# Patient Record
Sex: Male | Born: 1975
Health system: Southern US, Community
[De-identification: ages and names within clinical notes are randomized; demographics above are authoritative.]

## PROBLEM LIST (undated history)

## (undated) DIAGNOSIS — J45909 Unspecified asthma, uncomplicated: Secondary | ICD-10-CM

## (undated) DIAGNOSIS — D569 Thalassemia, unspecified: Secondary | ICD-10-CM

## (undated) DIAGNOSIS — T7840XA Allergy, unspecified, initial encounter: Secondary | ICD-10-CM

## (undated) HISTORY — DX: Unspecified asthma, uncomplicated: J45.909

## (undated) HISTORY — DX: Allergy, unspecified, initial encounter: T78.40XA

## (undated) HISTORY — DX: Thalassemia, unspecified: D56.9

---

## 2001-09-11 ENCOUNTER — Encounter: Payer: Self-pay | Admitting: Emergency Medicine

## 2001-09-11 ENCOUNTER — Emergency Department (HOSPITAL_COMMUNITY): Admission: EM | Admit: 2001-09-11 | Discharge: 2001-09-11 | Payer: Self-pay | Admitting: Emergency Medicine

## 2001-12-23 ENCOUNTER — Encounter: Payer: Self-pay | Admitting: Orthopedic Surgery

## 2001-12-23 ENCOUNTER — Ambulatory Visit (HOSPITAL_COMMUNITY): Admission: RE | Admit: 2001-12-23 | Discharge: 2001-12-23 | Payer: Self-pay | Admitting: Orthopedic Surgery

## 2007-11-08 ENCOUNTER — Emergency Department (HOSPITAL_COMMUNITY): Admission: EM | Admit: 2007-11-08 | Discharge: 2007-11-08 | Payer: Self-pay | Admitting: Emergency Medicine

## 2014-09-19 ENCOUNTER — Ambulatory Visit (INDEPENDENT_AMBULATORY_CARE_PROVIDER_SITE_OTHER): Payer: PRIVATE HEALTH INSURANCE | Admitting: Family Medicine

## 2014-09-19 VITALS — BP 120/81 | HR 62 | Temp 97.8°F | Resp 18 | Ht 68.0 in | Wt 176.0 lb

## 2014-09-19 DIAGNOSIS — R002 Palpitations: Secondary | ICD-10-CM

## 2014-09-19 DIAGNOSIS — R0789 Other chest pain: Secondary | ICD-10-CM

## 2014-09-19 DIAGNOSIS — J45990 Exercise induced bronchospasm: Secondary | ICD-10-CM

## 2014-09-19 DIAGNOSIS — Z8249 Family history of ischemic heart disease and other diseases of the circulatory system: Secondary | ICD-10-CM | POA: Diagnosis not present

## 2014-09-19 MED ORDER — ALBUTEROL SULFATE HFA 108 (90 BASE) MCG/ACT IN AERS: 2.0000 | INHALATION_SPRAY | RESPIRATORY_TRACT | Status: DC | PRN

## 2014-09-19 MED ORDER — ALBUTEROL SULFATE (2.5 MG/3ML) 0.083% IN NEBU
2.5000 mg | INHALATION_SOLUTION | Freq: Once | RESPIRATORY_TRACT | Status: AC
  Administered 2014-09-19: 2.5 mg via RESPIRATORY_TRACT

## 2014-09-19 NOTE — Patient Instructions (Addendum)
Use the albuterol inhaler 2 inhalations prior to exercise  If you are having chest pain please return or go to the emergency room   Continue getting regular exercise  Take Zyrtec one at bedtime (cetirizine)  Recommend a complete physical sometime

## 2014-09-19 NOTE — Progress Notes (Signed)
Subjective 39 year old man who is here with history of having been diagnosed a decade ago with exercise-induced asthma. He has gotten back into regular exercise and doing some running. When he is doing jogging gets wheezing. He gets some chest tightness sensation also a when he is breathing hard. There is a family history of heart disease in his father has atrial fibrillation intermittently. The patient notices occasional palpitations. He does not smoke. He generally considers himself healthy.  He is married and has 2 sons.  Objective: No major distress, healthy-appearing man. Neck supple without nodes. Chest is clear to also is. Heart regular without murmurs gallops or arrhythmias.  Assessment: History exercise-induced asthma Family history heart disease Chest tightness episodes Palpitations Allergic rhinitis  Plan: EKG, peak flow, nebulizer.  Peak flow was 525 improved to 550 after treatment  EKG normal  Albuterol inhaler Zyrtec  If not improving consider using Singulair or a steroid inhaler

## 2016-10-01 ENCOUNTER — Ambulatory Visit (INDEPENDENT_AMBULATORY_CARE_PROVIDER_SITE_OTHER): Payer: 59 | Admitting: Emergency Medicine

## 2016-10-01 VITALS — BP 111/77 | HR 59 | Temp 98.7°F | Ht 68.0 in | Wt 171.0 lb

## 2016-10-01 DIAGNOSIS — J069 Acute upper respiratory infection, unspecified: Secondary | ICD-10-CM | POA: Diagnosis not present

## 2016-10-01 DIAGNOSIS — R05 Cough: Secondary | ICD-10-CM | POA: Diagnosis not present

## 2016-10-01 DIAGNOSIS — R059 Cough, unspecified: Secondary | ICD-10-CM

## 2016-10-01 DIAGNOSIS — H1032 Unspecified acute conjunctivitis, left eye: Secondary | ICD-10-CM | POA: Diagnosis not present

## 2016-10-01 DIAGNOSIS — J45990 Exercise induced bronchospasm: Secondary | ICD-10-CM | POA: Diagnosis not present

## 2016-10-01 DIAGNOSIS — R070 Pain in throat: Secondary | ICD-10-CM

## 2016-10-01 MED ORDER — TOBRAMYCIN 0.3 % OP SOLN: 2.0000 [drp] | Freq: Four times a day (QID) | OPHTHALMIC | 0 refills | Status: AC

## 2016-10-01 MED ORDER — PROMETHAZINE-CODEINE 6.25-10 MG/5ML PO SYRP: 5.0000 mL | ORAL_SOLUTION | Freq: Every evening | ORAL | 0 refills | Status: DC | PRN

## 2016-10-01 MED ORDER — AZITHROMYCIN 250 MG PO TABS: ORAL_TABLET | ORAL | 0 refills | Status: DC

## 2016-10-01 NOTE — Patient Instructions (Addendum)
IF you received an x-ray today, you will receive an invoice from [facility]. Please contact [facility] at [phone] with questions or concerns regarding your invoice.   IF you received labwork today, you will receive an invoice from [facility]. Please contact [facility] at [phone] with questions or concerns regarding your invoice.   Our billing staff will not be able to assist you with questions regarding bills from these companies.  You will be contacted with the lab results as soon as they are available. The fastest way to get your results is to activate your My Chart account. Instructions are located on the last page of this paperwork. If you have not heard from [place] regarding the results in 2 weeks, please contact this office.      Upper Respiratory Infection, Adult Most upper respiratory infections (URIs) are caused by a virus. A URI affects the nose, throat, and upper air passages. The most common type of URI is often called "the common cold." Follow these instructions at home:  Take medicines only as told by your doctor.  Gargle warm saltwater or take cough drops to comfort your throat as told by your doctor.  Use a warm mist humidifier or inhale steam from a shower to increase air moisture. This may make it easier to breathe.  Drink enough fluid to keep your pee (urine) clear or pale yellow.  Eat soups and other clear broths.  Have a healthy diet.  Rest as needed.  Go back to work when your fever is gone or your doctor says it is okay.  You may need to stay home longer to avoid giving your URI to others.  You can also wear a face mask and wash your hands often to prevent spread of the virus.  Use your inhaler more if you have asthma.  Do not use any tobacco products, including cigarettes, chewing tobacco, or electronic cigarettes. If you need help quitting, ask your doctor. Contact a doctor if:  You are getting worse, not better.  Your  symptoms are not helped by medicine.  You have chills.  You are getting more short of breath.  You have brown or red mucus.  You have yellow or brown discharge from your nose.  You have pain in your face, especially when you bend forward.  You have a fever.  You have puffy (swollen) neck glands.  You have pain while swallowing.  You have white areas in the back of your throat. Get help right away if:  You have very bad or constant:  Headache.  Ear pain.  Pain in your forehead, behind your eyes, and over your cheekbones (sinus pain).  Chest pain.  You have long-lasting (chronic) lung disease and any of the following:  Wheezing.  Long-lasting cough.  Coughing up blood.  A change in your usual mucus.  You have a stiff neck.  You have changes in your:  Vision.  Hearing.  Thinking.  Mood. This information is not intended to replace advice given to you by your health care provider. Make sure you discuss any questions you have with your health care provider. Document Released: 12/02/2007 Document Revised: 02/16/2016 Document Reviewed: 09/20/2013 Elsevier Interactive Patient Education  2017 [employer] Inc[employer]  Bacterial Conjunctivitis Bacterial conjunctivitis is an infection of your conjunctiva. This is the clear membrane that covers the white part of your eye and the inner surface of your eyelid. This condition can make your eye:  Red or pink.  Itchy. This condition  is caused by bacteria. This condition spreads very easily from person to person (is contagious) and from one eye to the other eye. Follow these instructions at home: Medicines   Take or apply your antibiotic medicine as told by your doctor. Do not stop taking or applying the antibiotic even if you start to feel better.  Take or apply over-the-counter and prescription medicines only as told by your doctor.  Do not touch your eyelid with the eye drop bottle or the ointment tube. Managing  discomfort   Wipe any fluid from your eye with a warm, wet washcloth or a cotton ball.  Place a cool, clean washcloth on your eye. Do this for 10-20 minutes, 3-4 times per day. General instructions   Do not wear contact lenses until the irritation is gone. Wear glasses until your doctor says it is okay to wear contacts.  Do not wear eye makeup until your symptoms are gone. Throw away any old makeup.  Change or wash your pillowcase every day.  Do not share towels or washcloths with anyone.  Wash your hands often with soap and water. Use paper towels to dry your hands.  Do not touch or rub your eyes.  Do not drive or use heavy machinery if your vision is blurry. Contact a doctor if:  You have a fever.  Your symptoms do not get better after 10 days. Get help right away if:  You have a fever and your symptoms suddenly get worse.  You have very bad pain when you move your eye.  Your face:  Hurts.  Is red.  Is swollen.  You have sudden loss of vision. This information is not intended to replace advice given to you by your health care provider. Make sure you discuss any questions you have with your health care provider. Document Released: 03/24/2008 Document Revised: 11/21/2015 Document Reviewed: 03/28/2015 Elsevier Interactive Patient Education  2017 [employer].

## 2016-10-01 NOTE — Progress Notes (Signed)
Ray Wong 41 y.o.   Chief Complaint  Patient presents with  . Cough    symptoms x 5 days - yellow/green/brown product  . chest congestion  . Nasal Congestion  . Fatigue  . Eye Drainage    left eye x 2 days  . Sore Throat    HISTORY OF PRESENT ILLNESS: This is a 41 y.o. male has been sick with URI symptoms x 1 week; kids at home also sick but he "can't seem to shake it".  HPI   Prior to Admission medications   Medication Sig Start Date End Date Taking? Authorizing Provider  albuterol (PROVENTIL HFA;VENTOLIN HFA) 108 (90 BASE) MCG/ACT inhaler Inhale 2 puffs into the lungs every 4 (four) hours as needed for wheezing or shortness of breath (cough, shortness of breath or wheezing.). 09/19/14  Yes Ray Najjar, MD    No Known Allergies  There are no active problems to display for this patient.   Past Medical History:  Diagnosis Date  . Allergy   . Asthma   . Thalassemia     No past surgical history on file.  Social History   Social History  . Marital status: Single    Spouse name: N/A  . Number of children: N/A  . Years of education: N/A   Occupational History  . Not on file.   Social History Main Topics  . Smoking status: Never Smoker  . Smokeless tobacco: Never Used  . Alcohol use 0.0 oz/week  . Drug use: No  . Sexual activity: Not on file   Other Topics Concern  . Not on file   Social History Narrative  . No narrative on file    Family History  Problem Relation Age of Onset  . Heart disease Father      Review of Systems  Constitutional: Positive for fever and malaise/fatigue. Negative for chills.  HENT: Positive for congestion and sore throat. Negative for ear discharge, ear pain and nosebleeds.   Eyes: Positive for discharge (left) and redness.  Respiratory: Positive for cough and sputum production. Negative for hemoptysis, shortness of breath and wheezing.   Cardiovascular: Negative for chest pain, palpitations and leg swelling.    Gastrointestinal: Negative for abdominal pain, blood in stool, diarrhea, nausea and vomiting.  Genitourinary: Negative for dysuria and hematuria.  Musculoskeletal: Negative for back pain, myalgias and neck pain.  Skin: Negative for rash.  Neurological: Positive for weakness. Negative for dizziness, sensory change, focal weakness and headaches.  Endo/Heme/Allergies: Negative.   All other systems reviewed and are negative.  Vitals:   10/01/16 0944  BP: 111/77  Pulse: (!) 59  Temp: 98.7 F (37.1 C)     Physical Exam  Constitutional: He is oriented to person, place, and time. He appears well-developed and well-nourished.  HENT:  Head: Normocephalic and atraumatic.  Right Ear: External ear normal.  Left Ear: External ear normal.  Nose: Nose normal.  Mouth/Throat: Posterior oropharyngeal erythema present. No oropharyngeal exudate.  Eyes: EOM are normal. Pupils are equal, round, and reactive to light. Left eye exhibits discharge. Left conjunctiva is injected.  Neck: Normal range of motion. Neck supple. No JVD present. No thyromegaly present.  Cardiovascular: Normal rate, regular rhythm, normal heart sounds and intact distal pulses.   Pulmonary/Chest: Effort normal and breath sounds normal.  Abdominal: Soft. There is no tenderness.  Musculoskeletal: Normal range of motion.  Lymphadenopathy:    He has no cervical adenopathy.  Neurological: He is alert and oriented to person, place,  and time. No sensory deficit. He exhibits normal muscle tone.  Skin: Skin is warm and dry. Capillary refill takes less than 2 seconds. No rash noted.  Psychiatric: He has a normal mood and affect. His behavior is normal.  Vitals reviewed.    ASSESSMENT & PLAN: Ray Wong was seen today for cough, chest congestion, nasal congestion, fatigue, eye drainage and sore throat.  Diagnoses and all orders for this visit:  Acute upper respiratory infection  Cough  Acute conjunctivitis of left eye, unspecified  acute conjunctivitis type  Pain in throat  Exercise-induced asthma  Other orders -     azithromycin (ZITHROMAX) 250 MG tablet; Sig as indicated -     promethazine-codeine (PHENERGAN WITH CODEINE) 6.25-10 MG/5ML syrup; Take 5 mLs by mouth at bedtime as needed for cough. -     tobramycin (TOBREX) 0.3 % ophthalmic solution; Place 2 drops into the left eye every 6 (six) hours.    Patient Instructions       IF you received an x-ray today, you will receive an invoice from [facility]. Please contact [facility] at [phone] with questions or concerns regarding your invoice.   IF you received labwork today, you will receive an invoice from [facility]. Please contact [facility] at [phone] with questions or concerns regarding your invoice.   Our billing staff will not be able to assist you with questions regarding bills from these companies.  You will be contacted with the lab results as soon as they are available. The fastest way to get your results is to activate your My Chart account. Instructions are located on the last page of this paperwork. If you have not heard from [place] regarding the results in 2 weeks, please contact this office.      Upper Respiratory Infection, Adult Most upper respiratory infections (URIs) are caused by a virus. A URI affects the nose, throat, and upper air passages. The most common type of URI is often called "the common cold." Follow these instructions at home:  Take medicines only as told by your doctor.  Gargle warm saltwater or take cough drops to comfort your throat as told by your doctor.  Use a warm mist humidifier or inhale steam from a shower to increase air moisture. This may make it easier to breathe.  Drink enough fluid to keep your pee (urine) clear or pale yellow.  Eat soups and other clear broths.  Have a healthy diet.  Rest as needed.  Go back to work when your fever is gone or your doctor says it is  okay.  You may need to stay home longer to avoid giving your URI to others.  You can also wear a face mask and wash your hands often to prevent spread of the virus.  Use your inhaler more if you have asthma.  Do not use any tobacco products, including cigarettes, chewing tobacco, or electronic cigarettes. If you need help quitting, ask your doctor. Contact a doctor if:  You are getting worse, not better.  Your symptoms are not helped by medicine.  You have chills.  You are getting more short of breath.  You have brown or red mucus.  You have yellow or brown discharge from your nose.  You have pain in your face, especially when you bend forward.  You have a fever.  You have puffy (swollen) neck glands.  You have pain while swallowing.  You have white areas in the back of your throat. Get help right away if:  You have very bad or constant:  Headache.  Ear pain.  Pain in your forehead, behind your eyes, and over your cheekbones (sinus pain).  Chest pain.  You have long-lasting (chronic) lung disease and any of the following:  Wheezing.  Long-lasting cough.  Coughing up blood.  A change in your usual mucus.  You have a stiff neck.  You have changes in your:  Vision.  Hearing.  Thinking.  Mood. This information is not intended to replace advice given to you by your health care provider. Make sure you discuss any questions you have with your health care provider. Document Released: 12/02/2007 Document Revised: 02/16/2016 Document Reviewed: 09/20/2013 Elsevier Interactive Patient Education  2017 [employer] Inc.  Bacterial Conjunctivitis Bacterial conjunctivitis is an infection of your conjunctiva. This is the clear membrane that covers the white part of your eye and the inner surface of your eyelid. This condition can make your eye:  Red or pink.  Itchy. This condition is caused by bacteria. This condition spreads very easily from person to person  (is contagious) and from one eye to the other eye. Follow these instructions at home: Medicines   Take or apply your antibiotic medicine as told by your doctor. Do not stop taking or applying the antibiotic even if you start to feel better.  Take or apply over-the-counter and prescription medicines only as told by your doctor.  Do not touch your eyelid with the eye drop bottle or the ointment tube. Managing discomfort   Wipe any fluid from your eye with a warm, wet washcloth or a cotton ball.  Place a cool, clean washcloth on your eye. Do this for 10-20 minutes, 3-4 times per day. General instructions   Do not wear contact lenses until the irritation is gone. Wear glasses until your doctor says it is okay to wear contacts.  Do not wear eye makeup until your symptoms are gone. Throw away any old makeup.  Change or wash your pillowcase every day.  Do not share towels or washcloths with anyone.  Wash your hands often with soap and water. Use paper towels to dry your hands.  Do not touch or rub your eyes.  Do not drive or use heavy machinery if your vision is blurry. Contact a doctor if:  You have a fever.  Your symptoms do not get better after 10 days. Get help right away if:  You have a fever and your symptoms suddenly get worse.  You have very bad pain when you move your eye.  Your face:  Hurts.  Is red.  Is swollen.  You have sudden loss of vision. This information is not intended to replace advice given to you by your health care provider. Make sure you discuss any questions you have with your health care provider. Document Released: 03/24/2008 Document Revised: 11/21/2015 Document Reviewed: 03/28/2015 Elsevier Interactive Patient Education  2017 [employer] Inc.      Edwina Barth, MD [facility]

## 2017-03-03 ENCOUNTER — Ambulatory Visit (INDEPENDENT_AMBULATORY_CARE_PROVIDER_SITE_OTHER): Payer: 59 | Admitting: Emergency Medicine

## 2017-03-03 ENCOUNTER — Encounter: Payer: Self-pay | Admitting: Emergency Medicine

## 2017-03-03 VITALS — BP 127/82 | HR 54 | Temp 98.1°F | Resp 18 | Ht 68.5 in | Wt 172.0 lb

## 2017-03-03 DIAGNOSIS — Z0000 Encounter for general adult medical examination without abnormal findings: Secondary | ICD-10-CM | POA: Diagnosis not present

## 2017-03-03 NOTE — Patient Instructions (Addendum)
   IF you received an x-ray today, you will receive an invoice from [facility]. Please contact [facility] at [phone] with questions or concerns regarding your invoice.   IF you received labwork today, you will receive an invoice from [facility]. Please contact [facility] at [phone] with questions or concerns regarding your invoice.   Our billing staff will not be able to assist you with questions regarding bills from these companies.  You will be contacted with the lab results as soon as they are available. The fastest way to get your results is to activate your My Chart account. Instructions are located on the last page of this paperwork. If you have not heard from us regarding the results in 2 weeks, please contact this office.      Health Maintenance, Male A healthy lifestyle and preventive care is important for your health and wellness. Ask your health care provider about what schedule of regular examinations is right for you. What should I know about weight and diet? Eat a Healthy Diet  Eat plenty of vegetables, fruits, whole grains, low-fat dairy products, and lean protein.  Do not eat a lot of foods high in solid fats, added sugars, or salt.  Maintain a Healthy Weight Regular exercise can help you achieve or maintain a healthy weight. You should:  Do at least 150 minutes of exercise each week. The exercise should increase your heart rate and make you sweat (moderate-intensity exercise).  Do strength-training exercises at least twice a week.  Watch Your Levels of Cholesterol and Blood Lipids  Have your blood tested for lipids and cholesterol every 5 years starting at 42 years of age. If you are at high risk for heart disease, you should start having your blood tested when you are 42 years old. You may need to have your cholesterol levels checked more often if: ? Your lipid or cholesterol levels are high. ? You are older than 42 years of age. ? You  are at high risk for heart disease.  What should I know about cancer screening? Many types of cancers can be detected early and may often be prevented. Lung Cancer  You should be screened every year for lung cancer if: ? You are a current smoker who has smoked for at least 30 years. ? You are a former smoker who has quit within the past 15 years.  Talk to your health care provider about your screening options, when you should start screening, and how often you should be screened.  Colorectal Cancer  Routine colorectal cancer screening usually begins at 42 years of age and should be repeated every 5-10 years until you are 42 years old. You may need to be screened more often if early forms of precancerous polyps or small growths are found. Your health care provider may recommend screening at an earlier age if you have risk factors for colon cancer.  Your health care provider may recommend using home test kits to check for hidden blood in the stool.  A small camera at the end of a tube can be used to examine your colon (sigmoidoscopy or colonoscopy). This checks for the earliest forms of colorectal cancer.  Prostate and Testicular Cancer  Depending on your age and overall health, your health care provider may do certain tests to screen for prostate and testicular cancer.  Talk to your health care provider about any symptoms or concerns you have about testicular or prostate cancer.  Skin Cancer  Check your skin   from head to toe regularly.  Tell your health care provider about any new moles or changes in moles, especially if: ? There is a change in a mole's size, shape, or color. ? You have a mole that is larger than a pencil eraser.  Always use sunscreen. Apply sunscreen liberally and repeat throughout the day.  Protect yourself by wearing long sleeves, pants, a wide-brimmed hat, and sunglasses when outside.  What should I know about heart disease, diabetes, and high blood  pressure?  If you are 18-39 years of age, have your blood pressure checked every 3-5 years. If you are 40 years of age or older, have your blood pressure checked every year. You should have your blood pressure measured twice-once when you are at a hospital or clinic, and once when you are not at a hospital or clinic. Record the average of the two measurements. To check your blood pressure when you are not at a hospital or clinic, you can use: ? An automated blood pressure machine at a pharmacy. ? A home blood pressure monitor.  Talk to your health care provider about your target blood pressure.  If you are between 45-79 years old, ask your health care provider if you should take aspirin to prevent heart disease.  Have regular diabetes screenings by checking your fasting blood sugar level. ? If you are at a normal weight and have a low risk for diabetes, have this test once every three years after the age of 45. ? If you are overweight and have a high risk for diabetes, consider being tested at a younger age or more often.  A one-time screening for abdominal aortic aneurysm (AAA) by ultrasound is recommended for men aged 65-75 years who are current or former smokers. What should I know about preventing infection? Hepatitis B If you have a higher risk for hepatitis B, you should be screened for this virus. Talk with your health care provider to find out if you are at risk for hepatitis B infection. Hepatitis C Blood testing is recommended for:  Everyone born from 1945 through 1965.  Anyone with known risk factors for hepatitis C.  Sexually Transmitted Diseases (STDs)  You should be screened each year for STDs including gonorrhea and chlamydia if: ? You are sexually active and are younger than 42 years of age. ? You are older than 42 years of age and your health care provider tells you that you are at risk for this type of infection. ? Your sexual activity has changed since you were last  screened and you are at an increased risk for chlamydia or gonorrhea. Ask your health care provider if you are at risk.  Talk with your health care provider about whether you are at high risk of being infected with HIV. Your health care provider may recommend a prescription medicine to help prevent HIV infection.  What else can I do?  Schedule regular health, dental, and eye exams.  Stay current with your vaccines (immunizations).  Do not use any tobacco products, such as cigarettes, chewing tobacco, and e-cigarettes. If you need help quitting, ask your health care provider.  Limit alcohol intake to no more than 2 drinks per day. One drink equals 12 ounces of beer, 5 ounces of wine, or 1 ounces of hard liquor.  Do not use street drugs.  Do not share needles.  Ask your health care provider for help if you need support or information about quitting drugs.  Tell your health care   provider if you often feel depressed.  Tell your health care provider if you have ever been abused or do not feel safe at home. This information is not intended to replace advice given to you by your health care provider. Make sure you discuss any questions you have with your health care provider. Document Released: 12/12/2007 Document Revised: 02/12/2016 Document Reviewed: 03/19/2015 Elsevier Interactive Patient Education  2018 [employer]  American Heart Association (AHA) Exercise Recommendation  Being physically active is important to prevent heart disease and stroke, the nation's No. 1and No. 5killers. To improve overall cardiovascular health, we suggest at least 150 minutes per week of moderate exercise or 75 minutes per week of vigorous exercise (or a combination of moderate and vigorous activity). Thirty minutes a day, five times a week is an easy goal to remember. You will also experience benefits even if you divide your time into two or three segments of 10 to 15 minutes per day.  For people who would  benefit from lowering their blood pressure or cholesterol, we recommend 40 minutes of aerobic exercise of moderate to vigorous intensity three to four times a week to lower the risk for heart attack and stroke.  Physical activity is anything that makes you move your body and burn calories.  This includes things like climbing stairs or playing sports. Aerobic exercises benefit your heart, and include walking, jogging, swimming or biking. Strength and stretching exercises are best for overall stamina and flexibility.  The simplest, positive change you can make to effectively improve your heart health is to start walking. It's enjoyable, free, easy, social and great exercise. A walking program is flexible and boasts high success rates because people can stick with it. It's easy for walking to become a regular and satisfying part of life.   For Overall Cardiovascular Health:  At least 30 minutes of moderate-intensity aerobic activity at least 5 days per week for a total of 150  OR   At least 25 minutes of vigorous aerobic activity at least 3 days per week for a total of 75 minutes; or a combination of moderate- and vigorous-intensity aerobic activity  AND   Moderate- to high-intensity muscle-strengthening activity at least 2 days per week for additional health benefits.  For Lowering Blood Pressure and Cholesterol  An average 40 minutes of moderate- to vigorous-intensity aerobic activity 3 or 4 times per week  What if I can't make it to the time goal? Something is always better than nothing! And everyone has to start somewhere. Even if you've been sedentary for years, today is the day you can begin to make healthy changes in your life. If you don't think you'll make it for 30 or 40 minutes, set a reachable goal for today. You can work up toward your overall goal by increasing your time as you get stronger. Don't let all-or-nothing thinking rob you of doing what you can every day.   Source:http://www.heart.org    

## 2017-03-03 NOTE — Progress Notes (Signed)
Ray Wong 41 y.o.   Chief Complaint  Patient presents with  . Annual Exam    HISTORY OF PRESENT ILLNESS: This is a 41 y.o. male here for annual exam.  HPI   Prior to Admission medications   Medication Sig Start Date End Date Taking? Authorizing Provider  albuterol (PROVENTIL HFA;VENTOLIN HFA) 108 (90 BASE) MCG/ACT inhaler Inhale 2 puffs into the lungs every 4 (four) hours as needed for wheezing or shortness of breath (cough, shortness of breath or wheezing.). 09/19/14  Yes Peyton Najjar, MD  azithromycin [facility]) 250 MG tablet Sig as indicated Patient not taking: Reported on 03/03/2017 10/01/16   Georgina Quint, MD  promethazine-codeine [facility] WITH CODEINE) 6.25-10 MG/5ML syrup Take 5 mLs by mouth at bedtime as needed for cough. Patient not taking: Reported on 03/03/2017 10/01/16   Georgina Quint, MD    No Known Allergies  Patient Active Problem List   Diagnosis Date Noted  . Acute upper respiratory infection 10/01/2016  . Cough 10/01/2016  . Acute conjunctivitis of left eye 10/01/2016  . Pain in throat 10/01/2016  . Exercise-induced asthma 10/01/2016    Past Medical History:  Diagnosis Date  . Allergy   . Asthma   . Thalassemia     No past surgical history on file.  Social History   Social History  . Marital status: Single    Spouse name: N/A  . Number of children: N/A  . Years of education: N/A   Occupational History  . Not on file.   Social History Main Topics  . Smoking status: Never Smoker  . Smokeless tobacco: Never Used  . Alcohol use 0.0 oz/week  . Drug use: No  . Sexual activity: Not on file   Other Topics Concern  . Not on file   Social History Narrative  . No narrative on file    Family History  Problem Relation Age of Onset  . Heart disease Father      Review of Systems  Constitutional: Negative.  Negative for fever, malaise/fatigue and weight loss.  HENT: Negative.   Eyes: Negative.   Respiratory:  Negative.   Cardiovascular: Negative.   Gastrointestinal: Negative.   Genitourinary: Negative.   Musculoskeletal: Positive for joint pain (left forearm and left foot pain).  Skin: Positive for rash (lesions in head and face).  Neurological: Negative.   Endo/Heme/Allergies: Negative.   Psychiatric/Behavioral: The patient is nervous/anxious (stress).   All other systems reviewed and are negative.    Vitals:   03/03/17 1531  BP: 127/82  Pulse: (!) 54  Resp: 18  Temp: 98.1 F (36.7 C)  SpO2: 98%     Physical Exam  Constitutional: He is oriented to person, place, and time. He appears well-developed and well-nourished.  HENT:  Head: Normocephalic and atraumatic.  Right Ear: External ear normal.  Left Ear: External ear normal.  Nose: Nose normal.  Mouth/Throat: Oropharynx is clear and moist.  Eyes: Pupils are equal, round, and reactive to light. Conjunctivae and EOM are normal.  Neck: Normal range of motion. Neck supple. No JVD present. No thyromegaly present.  Cardiovascular: Normal rate, regular rhythm, normal heart sounds and intact distal pulses.   Pulmonary/Chest: Effort normal and breath sounds normal.  Abdominal: Soft. Bowel sounds are normal. He exhibits no distension. There is no tenderness.  Musculoskeletal: Normal range of motion.  Left forearm: mild tenderness to proximal brachioradialis muscle Left foot: mild plantar heel tenderness  Lymphadenopathy:    He has no cervical  adenopathy.  Neurological: He is alert and oriented to person, place, and time. No sensory deficit. He exhibits normal muscle tone.  Skin: Skin is warm and dry. Capillary refill takes less than 2 seconds.  Several flat lesions to right side of face and scalp  Psychiatric: He has a normal mood and affect. His behavior is normal.  Vitals reviewed.    ASSESSMENT & PLAN: Ray Wong was seen today for annual exam.  Diagnoses and all orders for this visit:  Routine general medical examination at a  health care facility -     CBC with Differential -     Comprehensive metabolic panel -     Hemoglobin A1c -     Lipid panel -     PSA(Must document that pt has been informed of limitations of PSA testing.) -     TSH -     HIV antibody -     Ambulatory referral to Dermatology    Patient Instructions       IF you received an x-ray today, you will receive an invoice [facility]. Please contact [facility] at [phone] with questions or concerns regarding your invoice.   IF you received labwork today, you will receive an invoice from [facility]. Please contact [facility] at [phone] with questions or concerns regarding your invoice.   Our billing staff will not be able to assist you with questions regarding bills from these companies.  You will be contacted with the lab results as soon as they are available. The fastest way to get your results is to activate your My Chart account. Instructions are located on the last page of this paperwork. If you have not heard from us regarding the results in 2 weeks, please contact this office.         Health Maintenance, Male A healthy lifestyle and preventive care is important for your health and wellness. Ask your health care provider about what schedule of regular examinations is right for you. What should I know about weight and diet? Eat a Healthy Diet  Eat plenty of vegetables, fruits, whole grains, low-fat dairy products, and lean protein.  Do not eat a lot of foods high in solid fats, added sugars, or salt.  Maintain a Healthy Weight Regular exercise can help you achieve or maintain a healthy weight. You should:  Do at least 150 minutes of exercise each week. The exercise should increase your heart rate and make you sweat (moderate-intensity exercise).  Do strength-training exercises at least twice a week.  Watch Your Levels of Cholesterol and Blood Lipids  Have your blood tested for lipids and  cholesterol every 5 years starting at 42 years of age. If you are at high risk for heart disease, you should start having your blood tested when you are 42 years old. You may need to have your cholesterol levels checked more often if: ? Your lipid or cholesterol levels are high. ? You are older than 42 years of age. ? You are at high risk for heart disease.  What should I know about cancer screening? Many types of cancers can be detected early and may often be prevented. Lung Cancer  You should be screened every year for lung cancer if: ? You are a current smoker who has smoked for at least 30 years. ? You are a former smoker who has quit within the past 15 years.  Talk to your health care provider about your screening options, when you should start screening, and  how often you should be screened.  Colorectal Cancer  Routine colorectal cancer screening usually begins at 42 years of age and should be repeated every 5-10 years until you are 42 years old. You may need to be screened more often if early forms of precancerous polyps or small growths are found. Your health care provider may recommend screening at an earlier age if you have risk factors for colon cancer.  Your health care provider may recommend using home test kits to check for hidden blood in the stool.  A small camera at the end of a tube can be used to examine your colon (sigmoidoscopy or colonoscopy). This checks for the earliest forms of colorectal cancer.  Prostate and Testicular Cancer  Depending on your age and overall health, your health care provider may do certain tests to screen for prostate and testicular cancer.  Talk to your health care provider about any symptoms or concerns you have about testicular or prostate cancer.  Skin Cancer  Check your skin from head to toe regularly.  Tell your health care provider about any new moles or changes in moles, especially if: ? There is a change in a mole's size, shape,  or color. ? You have a mole that is larger than a pencil eraser.  Always use sunscreen. Apply sunscreen liberally and repeat throughout the day.  Protect yourself by wearing long sleeves, pants, a wide-brimmed hat, and sunglasses when outside.  What should I know about heart disease, diabetes, and high blood pressure?  If you are 52-69 years of age, have your blood pressure checked every 3-5 years. If you are 42 years of age or older, have your blood pressure checked every year. You should have your blood pressure measured twice-once when you are at a hospital or clinic, and once when you are not at a hospital or clinic. Record the average of the two measurements. To check your blood pressure when you are not at a hospital or clinic, you can use: ? An automated blood pressure machine at a pharmacy. ? A home blood pressure monitor.  Talk to your health care provider about your target blood pressure.  If you are between 30-67 years old, ask your health care provider if you should take aspirin to prevent heart disease.  Have regular diabetes screenings by checking your fasting blood sugar level. ? If you are at a normal weight and have a low risk for diabetes, have this test once every three years after the age of 27. ? If you are overweight and have a high risk for diabetes, consider being tested at a younger age or more often.  A one-time screening for abdominal aortic aneurysm (AAA) by ultrasound is recommended for men aged 65-75 years who are current or former smokers. What should I know about preventing infection? Hepatitis B If you have a higher risk for hepatitis B, you should be screened for this virus. Talk with your health care provider to find out if you are at risk for hepatitis B infection. Hepatitis C Blood testing is recommended for:  Everyone born from [birth year] through 1965.  Anyone with known risk factors for hepatitis C.  Sexually Transmitted Diseases (STDs)  You should  be screened each year for STDs including gonorrhea and chlamydia if: ? You are sexually active and are younger than 42 years of age. ? You are older than 42 years of age and your health care provider tells you that you are at risk for this type  of infection. ? Your sexual activity has changed since you were last screened and you are at an increased risk for chlamydia or gonorrhea. Ask your health care provider if you are at risk.  Talk with your health care provider about whether you are at high risk of being infected with HIV. Your health care provider may recommend a prescription medicine to help prevent HIV infection.  What else can I do?  Schedule regular health, dental, and eye exams.  Stay current with your vaccines (immunizations).  Do not use any tobacco products, such as cigarettes, chewing tobacco, and e-cigarettes. If you need help quitting, ask your health care provider.  Limit alcohol intake to no more than 2 drinks per day. One drink equals 12 ounces of beer, 5 ounces of wine, or 1 ounces of hard liquor.  Do not use street drugs.  Do not share needles.  Ask your health care provider for help if you need support or information about quitting drugs.  Tell your health care provider if you often feel depressed.  Tell your health care provider if you have ever been abused or do not feel safe at home. This information is not intended to replace advice given to you by your health care provider. Make sure you discuss any questions you have with your health care provider. Document Released: 12/12/2007 Document Revised: 02/12/2016 Document Reviewed: 03/19/2015 Elsevier Interactive Patient Education  2018 [employer].  American Heart Association (AHA) Exercise Recommendation  Being physically active is important to prevent heart disease and stroke, the nation's No. 1and No. 5killers. To improve overall cardiovascular health, we suggest at least 150 minutes per week of moderate  exercise or 75 minutes per week of vigorous exercise (or a combination of moderate and vigorous activity). Thirty minutes a day, five times a week is an easy goal to remember. You will also experience benefits even if you divide your time into two or three segments of 10 to 15 minutes per day.  For people who would benefit from lowering their blood pressure or cholesterol, we recommend 40 minutes of aerobic exercise of moderate to vigorous intensity three to four times a week to lower the risk for heart attack and stroke.  Physical activity is anything that makes you move your body and burn calories.  This includes things like climbing stairs or playing sports. Aerobic exercises benefit your heart, and include walking, jogging, swimming or biking. Strength and stretching exercises are best for overall stamina and flexibility.  The simplest, positive change you can make to effectively improve your heart health is to start walking. It's enjoyable, free, easy, social and great exercise. A walking program is flexible and boasts high success rates because people can stick with it. It's easy for walking to become a regular and satisfying part of life.   For Overall Cardiovascular Health:  At least 30 minutes of moderate-intensity aerobic activity at least 5 days per week for a total of 150  OR   At least 25 minutes of vigorous aerobic activity at least 3 days per week for a total of 75 minutes; or a combination of moderate- and vigorous-intensity aerobic activity  AND   Moderate- to high-intensity muscle-strengthening activity at least 2 days per week for additional health benefits.  For Lowering Blood Pressure and Cholesterol  An average 40 minutes of moderate- to vigorous-intensity aerobic activity 3 or 4 times per week  What if I can't make it to the time goal? Something is always better than  nothing! And everyone has to start somewhere. Even if you've been sedentary for years, today is  the day you can begin to make healthy changes in your life. If you don't think you'll make it for 30 or 40 minutes, set a reachable goal for today. You can work up toward your overall goal by increasing your time as you get stronger. Don't let all-or-nothing thinking rob you of doing what you can every day.  Source:[email or web address], MD [facility]

## 2017-03-04 ENCOUNTER — Other Ambulatory Visit: Payer: Self-pay | Admitting: Emergency Medicine

## 2017-03-04 DIAGNOSIS — D569 Thalassemia, unspecified: Secondary | ICD-10-CM

## 2017-03-04 LAB — HEMOGLOBIN A1C
ESTIMATED AVERAGE GLUCOSE: 94 mg/dL
HEMOGLOBIN A1C: 4.9 % (ref 4.8–5.6)

## 2017-03-04 LAB — COMPREHENSIVE METABOLIC PANEL
ALBUMIN: 4.7 g/dL (ref 3.5–5.5)
ALK PHOS: 67 IU/L (ref 39–117)
ALT: 13 IU/L (ref 0–44)
AST: 15 IU/L (ref 0–40)
Albumin/Globulin Ratio: 2.2 (ref 1.2–2.2)
BUN / CREAT RATIO: 13 (ref 9–20)
BUN: 17 mg/dL (ref 6–24)
Bilirubin Total: 1.2 mg/dL (ref 0.0–1.2)
CALCIUM: 9.2 mg/dL (ref 8.7–10.2)
CO2: 24 mmol/L (ref 20–29)
CREATININE: 1.26 mg/dL (ref 0.76–1.27)
Chloride: 105 mmol/L (ref 96–106)
GFR calc Af Amer: 81 mL/min/{1.73_m2} (ref >59)
GFR calc non Af Amer: 70 mL/min/{1.73_m2} (ref >59)
Globulin, Total: 2.1 g/dL (ref 1.5–4.5)
Glucose: 92 mg/dL (ref 65–99)
Potassium: 4.7 mmol/L (ref 3.5–5.2)
Sodium: 143 mmol/L (ref 134–144)
Total Protein: 6.8 g/dL (ref 6.0–8.5)

## 2017-03-04 LAB — CBC WITH DIFFERENTIAL/PLATELET
BASOS ABS: 0 10*3/uL (ref 0.0–0.2)
Basos: 2 %
EOS (ABSOLUTE): 0 10*3/uL (ref 0.0–0.4)
EOS: 1 %
HEMATOCRIT: 38.5 % (ref 37.5–51.0)
HEMOGLOBIN: 12.1 g/dL — AB (ref 13.0–17.7)
IMMATURE GRANULOCYTES: 0 %
Immature Grans (Abs): 0 10*3/uL (ref 0.0–0.1)
LYMPHS ABS: 1 10*3/uL (ref 0.7–3.1)
LYMPHS: 40 %
MCH: 19.5 pg — ABNORMAL LOW (ref 26.6–33.0)
MCHC: 31.4 g/dL — AB (ref 31.5–35.7)
MCV: 62 fL — ABNORMAL LOW (ref 79–97)
MONOCYTES: 15 %
Monocytes Absolute: 0.4 10*3/uL (ref 0.1–0.9)
NEUTROS PCT: 42 %
Neutrophils Absolute: 1.1 10*3/uL — ABNORMAL LOW (ref 1.4–7.0)
Platelets: 181 10*3/uL (ref 150–379)
RBC: 6.21 x10E6/uL — AB (ref 4.14–5.80)
RDW: 17.7 % — ABNORMAL HIGH (ref 12.3–15.4)
WBC: 2.5 10*3/uL — AB (ref 3.4–10.8)

## 2017-03-04 LAB — LIPID PANEL
Chol/HDL Ratio: 2.9 ratio (ref 0.0–5.0)
Cholesterol, Total: 129 mg/dL (ref 100–199)
HDL: 45 mg/dL (ref >39)
LDL Calculated: 66 mg/dL (ref 0–99)
TRIGLYCERIDES: 92 mg/dL (ref 0–149)
VLDL CHOLESTEROL CAL: 18 mg/dL (ref 5–40)

## 2017-03-04 LAB — TSH: TSH: 1.54 u[IU]/mL (ref 0.450–4.500)

## 2017-03-04 LAB — PSA: PROSTATE SPECIFIC AG, SERUM: 1.6 ng/mL (ref 0.0–4.0)

## 2017-03-04 LAB — HIV ANTIBODY (ROUTINE TESTING W REFLEX): HIV SCREEN 4TH GENERATION: NONREACTIVE

## 2017-03-05 ENCOUNTER — Encounter: Payer: Self-pay | Admitting: Radiology

## 2017-03-15 ENCOUNTER — Encounter: Payer: Self-pay | Admitting: Physician Assistant

## 2017-03-15 ENCOUNTER — Ambulatory Visit (INDEPENDENT_AMBULATORY_CARE_PROVIDER_SITE_OTHER): Payer: 59

## 2017-03-15 ENCOUNTER — Ambulatory Visit (INDEPENDENT_AMBULATORY_CARE_PROVIDER_SITE_OTHER): Payer: 59 | Admitting: Physician Assistant

## 2017-03-15 VITALS — BP 127/82 | HR 60 | Resp 16 | Ht 68.5 in | Wt 171.8 lb

## 2017-03-15 DIAGNOSIS — D72819 Decreased white blood cell count, unspecified: Secondary | ICD-10-CM

## 2017-03-15 DIAGNOSIS — Z8781 Personal history of (healed) traumatic fracture: Secondary | ICD-10-CM

## 2017-03-15 DIAGNOSIS — S1982XA Other specified injuries of cervical trachea, initial encounter: Secondary | ICD-10-CM | POA: Diagnosis not present

## 2017-03-15 DIAGNOSIS — M542 Cervicalgia: Secondary | ICD-10-CM

## 2017-03-15 DIAGNOSIS — D709 Neutropenia, unspecified: Secondary | ICD-10-CM | POA: Insufficient documentation

## 2017-03-15 MED ORDER — MELOXICAM 15 MG PO TABS: 7.5000 mg | ORAL_TABLET | Freq: Every day | ORAL | 0 refills | Status: AC

## 2017-03-15 MED ORDER — TRAMADOL HCL 50 MG PO TABS: 25.0000 mg | ORAL_TABLET | Freq: Two times a day (BID) | ORAL | 0 refills | Status: DC | PRN

## 2017-03-15 NOTE — Progress Notes (Signed)
03/15/2017 9:59 AM   DOB: [birth year] / MRN: [id number]  SUBJECTIVE:  Ray Wong is a 41 y.o. male presenting for neck pain after horseplay with his kids last night.  Felt a pop.  Has tried ibuprofen and tylenol last night and today with poor relief.  No tingling or weakness in his hands. History of fracture about C6-C7 healed in roughly 2003.   He has No Known Allergies.   He  has a past medical history of Allergy; Asthma; and Thalassemia.    He  reports that he has never smoked. He has never used smokeless tobacco. He reports that he drinks alcohol. He reports that he does not use drugs. He  has no sexual activity history on file. The patient  has no past surgical history on file.  His family history includes Heart disease in his father.  Review of Systems  Constitutional: Negative for chills, diaphoresis and fever.  Respiratory: Negative for shortness of breath.   Cardiovascular: Negative for chest pain, orthopnea and leg swelling.  Gastrointestinal: Negative for nausea.  Skin: Negative for rash.  Neurological: Negative for dizziness.    The problem list and medications were reviewed and updated by myself where necessary and exist elsewhere in the encounter.   OBJECTIVE:  BP 127/82 (BP Location: Right Arm, Patient Position: Sitting, Cuff Size: Normal)   Pulse 60   Resp 16   Ht 5' 8.5" (1.74 m)   Wt 171 lb 12.8 oz (77.9 kg)   SpO2 99%   BMI 25.74 kg/m   Lab Results  Component Value Date   WBC 2.5 (LL) 03/03/2017   HGB 12.1 (L) 03/03/2017   HCT 38.5 03/03/2017   MCV 62 (L) 03/03/2017   PLT 181 03/03/2017   Physical Exam  Constitutional: He appears well-developed. He is active and cooperative.  Non-toxic appearance.  Cardiovascular: Normal rate, regular rhythm, S1 normal, S2 normal, normal heart sounds, intact distal pulses and normal pulses.  Exam reveals no gallop and no friction rub.   No murmur heard. Pulmonary/Chest: Effort normal. No stridor. No tachypnea.  No respiratory distress. He has no wheezes. He has no rales.  Abdominal: He exhibits no distension.  Musculoskeletal: Normal range of motion. He exhibits no edema or deformity.       Cervical back: He exhibits tenderness and pain. He exhibits no bony tenderness, no swelling, no edema and no spasm.  Neurological: He is alert.  Skin: Skin is warm and dry. He is not diaphoretic. No pallor.  Vitals reviewed.   No results found for this or any previous visit (from the past 72 hour(s)).  Dg Cervical Spine 2 Or 3 Views  Result Date: 03/15/2017 CLINICAL DATA:  Neck pain status post fall. EXAM: CERVICAL SPINE - 2-3 VIEW COMPARISON:  11/08/2007 FINDINGS: There is no evidence of cervical spine fracture or prevertebral soft tissue swelling. Alignment is normal. No other significant bone abnormalities are identified. Degenerative disc disease with mild disc height loss at C5-6 and C6-7. Bilateral facet arthropathy at C7-T1. IMPRESSION: No acute osseous injury of the cervical spine. Electronically Signed   By: Elige Ko   On: 03/15/2017 09:56    ASSESSMENT AND PLAN:  [name] was seen today for neck pain.  Diagnoses and all orders for this visit:  Neck pain: Exam unremarkable.  Given problem two I felt images would be helpful to establish new baseline.  Meloxciam and tramadol along with about 3 weeks.  Will step up to pred if needed.  If that fails then neurosurgery.  -     DG Cervical Spine 2 or 3 views; Future -     meloxicam (MOBIC) 15 MG tablet; Take 0.5-1 tablets (7.5-15 mg total) by mouth daily. Take with food. Do not take Ibuprofen, Goody's, or Aleve while taking this medication. -     traMADol (ULTRAM) 50 MG tablet; Take 0.5-1 tablets (25-50 mg total) by mouth every 12 (twelve) hours as needed.  History of cervical fracture  Leukopenia, unspecified type Comments: Hematology referral is out at this time.     The patient is advised to call or return to clinic if he does not see an  improvement in symptoms, or to seek the care of the closest emergency department if he worsens with the above plan.   Deliah Boston, MHS, PA-C Primary Care at [facility] 03/15/2017 9:59 AM

## 2017-03-15 NOTE — Patient Instructions (Signed)
  FINDINGS: There is no evidence of cervical spine fracture or prevertebral soft tissue swelling. Alignment is normal. No other significant bone abnormalities are identified. Degenerative disc disease with mild disc height loss at C5-6 and C6-7. Bilateral facet arthropathy at C7-T1.  IMPRESSION: No acute osseous injury of the cervical spine.  Lets give it about three weeks.  If you are getting worse we can consider prednisone by mouth.

## 2017-03-16 ENCOUNTER — Other Ambulatory Visit: Payer: Self-pay | Admitting: Emergency Medicine

## 2017-03-16 ENCOUNTER — Telehealth: Payer: Self-pay | Admitting: Emergency Medicine

## 2017-03-16 DIAGNOSIS — L989 Disorder of the skin and subcutaneous tissue, unspecified: Secondary | ICD-10-CM

## 2017-03-16 NOTE — Telephone Encounter (Signed)
Done

## 2017-03-16 NOTE — Telephone Encounter (Signed)
Pt has referral for dermatology but diagnosis is for routine medical exam. I believe pt is being referred for a rash according to OV notes. Can we get a diagnosis code placed for this? Thanks!

## 2017-03-19 ENCOUNTER — Telehealth: Payer: Self-pay

## 2017-03-19 NOTE — Telephone Encounter (Signed)
Started PA over the phone.  They should have an answer within 5 days.

## 2017-03-29 NOTE — Telephone Encounter (Signed)
Received denial via fax this morning.  I will make patient and pharmacy aware.

## 2017-05-06 ENCOUNTER — Encounter: Payer: Self-pay | Admitting: Hematology and Oncology

## 2017-05-06 ENCOUNTER — Ambulatory Visit: Payer: 59 | Admitting: Hematology and Oncology

## 2017-05-06 ENCOUNTER — Telehealth: Payer: Self-pay | Admitting: Hematology and Oncology

## 2017-05-06 ENCOUNTER — Ambulatory Visit (HOSPITAL_BASED_OUTPATIENT_CLINIC_OR_DEPARTMENT_OTHER): Payer: 59

## 2017-05-06 VITALS — BP 116/87 | HR 74 | Temp 98.4°F | Resp 18 | Ht 68.5 in | Wt 175.1 lb

## 2017-05-06 DIAGNOSIS — R718 Other abnormality of red blood cells: Secondary | ICD-10-CM | POA: Diagnosis not present

## 2017-05-06 DIAGNOSIS — D709 Neutropenia, unspecified: Secondary | ICD-10-CM

## 2017-05-06 LAB — CBC & DIFF AND RETIC
BASO%: 1.1 % (ref 0.0–2.0)
Basophils Absolute: 0 10*3/uL (ref 0.0–0.1)
EOS%: 1.1 % (ref 0.0–7.0)
Eosinophils Absolute: 0 10*3/uL (ref 0.0–0.5)
HCT: 39.4 % (ref 38.4–49.9)
HGB: 12.5 g/dL — ABNORMAL LOW (ref 13.0–17.1)
Immature Retic Fract: 6.6 % (ref 3.00–10.60)
LYMPH#: 1.6 10*3/uL (ref 0.9–3.3)
LYMPH%: 42.7 % (ref 14.0–49.0)
MCH: 19.9 pg — ABNORMAL LOW (ref 27.2–33.4)
MCHC: 31.7 g/dL — AB (ref 32.0–36.0)
MCV: 62.8 fL — AB (ref 79.3–98.0)
MONO#: 0.4 10*3/uL (ref 0.1–0.9)
MONO%: 9.7 % (ref 0.0–14.0)
NEUT%: 45.4 % (ref 39.0–75.0)
NEUTROS ABS: 1.7 10*3/uL (ref 1.5–6.5)
PLATELETS: 212 10*3/uL (ref 140–400)
RBC: 6.27 10*6/uL — AB (ref 4.20–5.82)
RDW: 16.2 % — ABNORMAL HIGH (ref 11.0–14.6)
RETIC CT ABS: 142.96 10*3/uL — AB (ref 34.80–93.90)
Retic %: 2.28 % — ABNORMAL HIGH (ref 0.80–1.80)
WBC: 3.7 10*3/uL — AB (ref 4.0–10.3)

## 2017-05-06 LAB — IRON AND TIBC
%SAT: 41 % (ref 20–55)
IRON: 137 ug/dL (ref 42–163)
TIBC: 338 ug/dL (ref 202–409)
UIBC: 201 ug/dL (ref 117–376)

## 2017-05-06 LAB — COMPREHENSIVE METABOLIC PANEL
ALT: 56 U/L — AB (ref 0–55)
ANION GAP: 10 meq/L (ref 3–11)
AST: 25 U/L (ref 5–34)
Albumin: 4.7 g/dL (ref 3.5–5.0)
Alkaline Phosphatase: 66 U/L (ref 40–150)
BUN: 18.9 mg/dL (ref 7.0–26.0)
CALCIUM: 9.7 mg/dL (ref 8.4–10.4)
CHLORIDE: 104 meq/L (ref 98–109)
CO2: 26 meq/L (ref 22–29)
CREATININE: 1.2 mg/dL (ref 0.7–1.3)
Glucose: 88 mg/dl (ref 70–140)
Potassium: 4.2 mEq/L (ref 3.5–5.1)
Sodium: 139 mEq/L (ref 136–145)
Total Bilirubin: 1.73 mg/dL — ABNORMAL HIGH (ref 0.20–1.20)
Total Protein: 7.5 g/dL (ref 6.4–8.3)

## 2017-05-06 LAB — FERRITIN: FERRITIN: 483 ng/mL — AB (ref 22–316)

## 2017-05-06 LAB — LACTATE DEHYDROGENASE: LDH: 155 U/L (ref 125–245)

## 2017-05-06 LAB — TECHNOLOGIST REVIEW

## 2017-05-06 LAB — CHCC SMEAR

## 2017-05-06 NOTE — Telephone Encounter (Signed)
Scheduled appt per 11/8 los - Gave patient AVS and calender per los.  

## 2017-05-07 LAB — CERULOPLASMIN: CERULOPLASMIN: 17.7 mg/dL (ref 16.0–31.0)

## 2017-05-08 LAB — COPPER, SERUM: Copper: 83 ug/dL (ref 72–166)

## 2017-05-13 ENCOUNTER — Encounter: Payer: Self-pay | Admitting: Hematology and Oncology

## 2017-05-13 ENCOUNTER — Telehealth: Payer: Self-pay | Admitting: Hematology and Oncology

## 2017-05-13 ENCOUNTER — Ambulatory Visit (HOSPITAL_BASED_OUTPATIENT_CLINIC_OR_DEPARTMENT_OTHER): Payer: 59

## 2017-05-13 ENCOUNTER — Ambulatory Visit: Payer: 59 | Admitting: Hematology and Oncology

## 2017-05-13 ENCOUNTER — Other Ambulatory Visit: Payer: Self-pay

## 2017-05-13 VITALS — BP 125/75 | HR 57 | Temp 97.9°F | Resp 18 | Ht 68.5 in | Wt 173.4 lb

## 2017-05-13 DIAGNOSIS — D709 Neutropenia, unspecified: Secondary | ICD-10-CM | POA: Diagnosis not present

## 2017-05-13 DIAGNOSIS — R718 Other abnormality of red blood cells: Secondary | ICD-10-CM

## 2017-05-13 DIAGNOSIS — D561 Beta thalassemia: Secondary | ICD-10-CM

## 2017-05-13 NOTE — Telephone Encounter (Signed)
Gave patient avs and calendar with appts per 11/15 los.  °

## 2017-05-14 LAB — HEMOGLOBINOPATHY EVALUATION
HGB C: 0 %
HGB S: 0 %
HGB VARIANT: 0 %
Hemoglobin A2 Quantitation: 4.9 % — ABNORMAL HIGH (ref 1.8–3.2)
Hemoglobin F Quantitation: 1.2 % (ref 0.0–2.0)
Hgb A: 93.9 % — ABNORMAL LOW (ref 96.4–98.8)

## 2017-05-16 NOTE — Progress Notes (Signed)
[facility] New Visit:  Assessment: Neutropenia [facility]) 41 y.o. male without any active symptoms presenting with history of thalassemia and leukopenia with predominance of neutropenia of unknown duration on the most recent lab work.  Patient has no evidence of active infections by symptoms or clinical examination.  Low white blood cell count may signify development of myelofibrosis as the patient's with diagnosis of thalassemia does suffer from inefficient erythropoiesis which puts increased strain on the bone marrow production capacity.  In addition, she has mild anemia with profound microcytosis and hypochromia consistent with beta thalassemia, but I would like to confirm the diagnosis by obtaining hemoglobin electrophoresis.  Plan: --Labs today as outlined below --Return to clinic in 1 week to discuss the findings  Voice recognition software was used and creation of this note. Despite my best effort at editing the text, some misspelling/errors may have occurred.  Orders Placed This Encounter  Procedures  . CBC & Diff and Retic    Standing Status:   Future    Number of Occurrences:   1    Standing Expiration Date:   05/06/2018  . Smear    Standing Status:   Future    Number of Occurrences:   1    Standing Expiration Date:   05/06/2018  . Lactate dehydrogenase (LDH)    Standing Status:   Future    Number of Occurrences:   1    Standing Expiration Date:   05/06/2018  . Comprehensive metabolic panel    Standing Status:   Future    Number of Occurrences:   1    Standing Expiration Date:   05/06/2018  . Ferritin    Standing Status:   Future    Number of Occurrences:   1    Standing Expiration Date:   05/06/2018  . Iron and TIBC    Standing Status:   Future    Number of Occurrences:   1    Standing Expiration Date:   05/06/2018  . Copper, serum    Standing Status:   Future    Number of Occurrences:   1    Standing Expiration Date:   05/06/2018  . Ceruloplasmin     Standing Status:   Future    Number of Occurrences:   1    Standing Expiration Date:   05/06/2018    All questions were answered.  . The patient knows to call the clinic with any problems, questions or concerns.  This note was electronically signed.    History of Presenting Illness Ray Wong 41 y.o. presenting to the [facility] for leukopenia evaluation read by Dr Horald Pollen.  Please see below for the available lab work.  Patient has a history of thalassemia that he has carried since his childhood.  He does not remember the type.  Otherwise, he denies any significant symptoms.  He denies any recurrent infections, fevers, chills, weight loss, appetite change, night sweats.  He denies any changes in activity tolerance.  No respiratory, gastrointestinal, or genitourinary complaints.  No skin lesions or neurological deficits.  Oncological/hematological History: --Labs, 03/03/17: WBC 2.5, ANC 1.1, ALC 1.0, Mono 0.4, Eos 0.0, Baso 0.0, Hgb 12.1 , MCV 62.0, MCH 19.5, RDW 17.7, Plt 181;   Medical History: Past Medical History:  Diagnosis Date  . Allergy   . Asthma   . Thalassemia     Surgical History: History reviewed. No pertinent surgical history.  Family History: Family History  Problem Relation  Age of Onset  . Heart disease Father     Social History: Social History   Socioeconomic History  . Marital status: Single    Spouse name: Not on file  . Number of children: Not on file  . Years of education: Not on file  . Highest education level: Not on file  Social Needs  . Financial resource strain: Not on file  . Food insecurity - worry: Not on file  . Food insecurity - inability: Not on file  . Transportation needs - medical: Not on file  . Transportation needs - non-medical: Not on file  Occupational History  . Not on file  Tobacco Use  . Smoking status: Never Smoker  . Smokeless tobacco: Never Used  Substance and Sexual Activity  . Alcohol use: Yes     Alcohol/week: 0.0 oz  . Drug use: No  . Sexual activity: Not on file  Other Topics Concern  . Not on file  Social History Narrative  . Not on file    Allergies: No Known Allergies  Medications:  Current Outpatient Medications  Medication Sig Dispense Refill  . albuterol (PROVENTIL HFA;VENTOLIN HFA) 108 (90 BASE) MCG/ACT inhaler Inhale 2 puffs into the lungs every 4 (four) hours as needed for wheezing or shortness of breath (cough, shortness of breath or wheezing.). 1 Inhaler 3  . azithromycin (ZITHROMAX) 250 MG tablet Sig as indicated 6 tablet 0  . promethazine-codeine (PHENERGAN WITH CODEINE) 6.25-10 MG/5ML syrup Take 5 mLs by mouth at bedtime as needed for cough. 120 mL 0  . traMADol (ULTRAM) 50 MG tablet Take 0.5-1 tablets (25-50 mg total) by mouth every 12 (twelve) hours as needed. 30 tablet 0   No current facility-administered medications for this visit.     Review of Systems: Review of Systems  All other systems reviewed and are negative.    PHYSICAL EXAMINATION Blood pressure 116/87, pulse 74, temperature 98.4 F (36.9 C), temperature source Oral, resp. rate 18, height 5' 8.5" (1.74 m), weight 175 lb 1.6 oz (79.4 kg), SpO2 100 %.  ECOG PERFORMANCE STATUS: 0 - Asymptomatic  Physical Exam  Constitutional: He is oriented to person, place, and time and well-developed, well-nourished, and in no distress. No distress.  HENT:  Head: Normocephalic and atraumatic.  Mouth/Throat: Oropharynx is clear and moist. No oropharyngeal exudate.  Eyes: Conjunctivae and EOM are normal. Pupils are equal, round, and reactive to light. No scleral icterus.  Neck: Normal range of motion. No thyromegaly present.  Cardiovascular: Normal rate, regular rhythm and normal heart sounds. Exam reveals no gallop.  No murmur heard. Pulmonary/Chest: Effort normal and breath sounds normal. No respiratory distress. He has no wheezes. He has no rales.  Abdominal: Soft. Bowel sounds are normal. He  exhibits no distension. There is no tenderness. There is no rebound and no guarding.  Musculoskeletal: Normal range of motion. He exhibits no edema.  Lymphadenopathy:    He has no cervical adenopathy.  Neurological: He is alert and oriented to person, place, and time. He has normal reflexes. No cranial nerve deficit.  Skin: Skin is warm and dry. No rash noted. He is not diaphoretic. No erythema. No pallor.     LABORATORY DATA: I have personally reviewed the data as listed: Appointment on 05/06/2017  Component Date Value Ref Range Status  . Ceruloplasmin 05/06/2017 17.7  16.0 - 31.0 mg/dL Final  . Copper 05/06/2017 83  72 - 166 ug/dL Final  Detection Limit = 5  . Iron 05/06/2017 137  42 - 163 ug/dL Final  . TIBC 05/06/2017 338  202 - 409 ug/dL Final  . UIBC 05/06/2017 201  117 - 376 ug/dL Final  . %SAT 05/06/2017 41  20 - 55 % Final  . Ferritin 05/06/2017 483* 22 - 316 ng/ml Final  . Sodium 05/06/2017 139  136 - 145 mEq/L Final  . Potassium 05/06/2017 4.2  3.5 - 5.1 mEq/L Final  . Chloride 05/06/2017 104  98 - 109 mEq/L Final  . CO2 05/06/2017 26  22 - 29 mEq/L Final  . Glucose 05/06/2017 88  70 - 140 mg/dl Final   Glucose reference range is for nonfasting patients. Fasting glucose reference range is 70- 100.  Marland Kitchen BUN 05/06/2017 18.9  7.0 - 26.0 mg/dL Final  . Creatinine 05/06/2017 1.2  0.7 - 1.3 mg/dL Final  . Total Bilirubin 05/06/2017 1.73* 0.20 - 1.20 mg/dL Final  . Alkaline Phosphatase 05/06/2017 66  40 - 150 U/L Final  . AST 05/06/2017 25  5 - 34 U/L Final  . ALT 05/06/2017 56* 0 - 55 U/L Final  . Total Protein 05/06/2017 7.5  6.4 - 8.3 g/dL Final  . Albumin 05/06/2017 4.7  3.5 - 5.0 g/dL Final  . Calcium 05/06/2017 9.7  8.4 - 10.4 mg/dL Final  . Anion Gap 05/06/2017 10  3 - 11 mEq/L Final  . EGFR 05/06/2017 >60  >60 ml/min/1.73 m2 Final   eGFR is calculated using the CKD-EPI Creatinine Equation (2009)  . LDH 05/06/2017 155  125  - 245 U/L Final  . Smear Result 05/06/2017 Smear Available   Final  . WBC 05/06/2017 3.7* 4.0 - 10.3 10e3/uL Final  . NEUT# 05/06/2017 1.7  1.5 - 6.5 10e3/uL Final  . HGB 05/06/2017 12.5* 13.0 - 17.1 g/dL Final  . HCT 05/06/2017 39.4  38.4 - 49.9 % Final  . Platelets 05/06/2017 212  140 - 400 10e3/uL Final  . MCV 05/06/2017 62.8* 79.3 - 98.0 fL Final  . MCH 05/06/2017 19.9* 27.2 - 33.4 pg Final  . MCHC 05/06/2017 31.7* 32.0 - 36.0 g/dL Final  . RBC 05/06/2017 6.27* 4.20 - 5.82 10e6/uL Final  . RDW 05/06/2017 16.2* 11.0 - 14.6 % Final  . lymph# 05/06/2017 1.6  0.9 - 3.3 10e3/uL Final  . MONO# 05/06/2017 0.4  0.1 - 0.9 10e3/uL Final  . Eosinophils Absolute 05/06/2017 0.0  0.0 - 0.5 10e3/uL Final  . Basophils Absolute 05/06/2017 0.0  0.0 - 0.1 10e3/uL Final  . NEUT% 05/06/2017 45.4  39.0 - 75.0 % Final  . LYMPH% 05/06/2017 42.7  14.0 - 49.0 % Final  . MONO% 05/06/2017 9.7  0.0 - 14.0 % Final  . EOS% 05/06/2017 1.1  0.0 - 7.0 % Final  . BASO% 05/06/2017 1.1  0.0 - 2.0 % Final  . Retic % 05/06/2017 2.28* 0.80 - 1.80 % Final  . Retic Ct Abs 05/06/2017 142.96* 34.80 - 93.90 10e3/uL Final  . Immature Retic Fract 05/06/2017 6.60  3.00 - 10.60 % Final  . Technologist Review 05/06/2017 Rare meta, few teardrops, sl basophilic stippling, occ Large platelets   Final         Ardath Sax, MD

## 2017-05-16 NOTE — Assessment & Plan Note (Signed)
41 y.o. male without any active symptoms presenting with history of thalassemia and leukopenia with predominance of neutropenia of unknown duration on the most recent lab work.  Patient has no evidence of active infections by symptoms or clinical examination.  Low white blood cell count may signify development of myelofibrosis as the patient's with diagnosis of thalassemia does suffer from inefficient erythropoiesis which puts increased strain on the bone marrow production capacity.  In addition, she has mild anemia with profound microcytosis and hypochromia consistent with beta thalassemia, but I would like to confirm the diagnosis by obtaining hemoglobin electrophoresis.  Plan: --Labs today as outlined below --Return to clinic in 1 week to discuss the findings

## 2017-05-27 ENCOUNTER — Encounter: Payer: Self-pay | Admitting: Emergency Medicine

## 2017-05-27 ENCOUNTER — Other Ambulatory Visit: Payer: Self-pay

## 2017-05-27 ENCOUNTER — Ambulatory Visit: Payer: 59 | Admitting: Emergency Medicine

## 2017-05-27 VITALS — BP 106/72 | HR 76 | Temp 98.2°F | Resp 16 | Ht 67.5 in | Wt 171.4 lb

## 2017-05-27 DIAGNOSIS — Z8709 Personal history of other diseases of the respiratory system: Secondary | ICD-10-CM

## 2017-05-27 DIAGNOSIS — R059 Cough, unspecified: Secondary | ICD-10-CM

## 2017-05-27 DIAGNOSIS — R05 Cough: Secondary | ICD-10-CM

## 2017-05-27 DIAGNOSIS — J029 Acute pharyngitis, unspecified: Secondary | ICD-10-CM

## 2017-05-27 DIAGNOSIS — R0981 Nasal congestion: Secondary | ICD-10-CM | POA: Diagnosis not present

## 2017-05-27 DIAGNOSIS — J069 Acute upper respiratory infection, unspecified: Secondary | ICD-10-CM

## 2017-05-27 DIAGNOSIS — D569 Thalassemia, unspecified: Secondary | ICD-10-CM | POA: Insufficient documentation

## 2017-05-27 DIAGNOSIS — L989 Disorder of the skin and subcutaneous tissue, unspecified: Secondary | ICD-10-CM

## 2017-05-27 MED ORDER — AMOXICILLIN-POT CLAVULANATE 875-125 MG PO TABS: 1.0000 | ORAL_TABLET | Freq: Two times a day (BID) | ORAL | 0 refills | Status: DC

## 2017-05-27 MED ORDER — PREDNISONE 20 MG PO TABS: 40.0000 mg | ORAL_TABLET | Freq: Every day | ORAL | 0 refills | Status: AC

## 2017-05-27 NOTE — Progress Notes (Signed)
Ray Wong 41 y.o.   Chief Complaint  Patient presents with  . nasal congestion    and chest congestion, yellow mucus x 6 days  . eyes    crusted this am, son was diagnosed w/pink eye  . Cough  . dry skin    in the last couple of days    HISTORY OF PRESENT ILLNESS: This is a 41 y.o. male complaining of URI symptoms x 5 days.  URI   This is a new problem. The current episode started in the past 7 days. The problem has been gradually worsening. There has been no fever. Associated symptoms include congestion, coughing, sinus pain and a sore throat. Pertinent negatives include no abdominal pain, chest pain, diarrhea, dysuria, ear pain, headaches, joint swelling, nausea, neck pain, plugged ear sensation, rash, vomiting or wheezing. Associated symptoms comments: Itchy eyes. He has tried nothing for the symptoms.     Prior to Admission medications   Medication Sig Start Date End Date Taking? Authorizing Provider  fluticasone (FLONASE) 50 MCG/ACT nasal spray Place into both nostrils daily.   Yes [provider]  Pseudoeph-Doxylamine-DM-APAP (NYQUIL PO) Take by mouth.   Yes [provider]  albuterol (PROVENTIL HFA;VENTOLIN HFA) 108 (90 BASE) MCG/ACT inhaler Inhale 2 puffs into the lungs every 4 (four) hours as needed for wheezing or shortness of breath (cough, shortness of breath or wheezing.). Patient not taking: Reported on 05/27/2017 09/19/14   Peyton Najjar, MD  azithromycin [facility]) 250 MG tablet Sig as indicated Patient not taking: Reported on 05/27/2017 10/01/16   Georgina Quint, MD  promethazine-codeine [facility] WITH CODEINE) 6.25-10 MG/5ML syrup Take 5 mLs by mouth at bedtime as needed for cough. Patient not taking: Reported on 05/27/2017 10/01/16   Georgina Quint, MD  traMADol (ULTRAM) 50 MG tablet Take 0.5-1 tablets (25-50 mg total) by mouth every 12 (twelve) hours as needed. Patient not taking: Reported on 05/27/2017 03/15/17   Ofilia Neas, PA-C    No Known Allergies  Patient Active Problem List   Diagnosis Date Noted  . History of cervical fracture 03/15/2017  . Neutropenia (HCC) 03/15/2017  . Routine general medical examination at a health care facility 03/03/2017  . Exercise-induced asthma 10/01/2016    Past Medical History:  Diagnosis Date  . Allergy   . Asthma   . Thalassemia     No past surgical history on file.  Social History   Socioeconomic History  . Marital status: Single    Spouse name: Not on file  . Number of children: Not on file  . Years of education: Not on file  . Highest education level: Not on file  Social Needs  . Financial resource strain: Not on file  . Food insecurity - worry: Not on file  . Food insecurity - inability: Not on file  . Transportation needs - medical: Not on file  . Transportation needs - non-medical: Not on file  Occupational History  . Not on file  Tobacco Use  . Smoking status: Never Smoker  . Smokeless tobacco: Never Used  Substance and Sexual Activity  . Alcohol use: Yes    Alcohol/week: 0.0 oz  . Drug use: No  . Sexual activity: Not on file  Other Topics Concern  . Not on file  Social History Narrative  . Not on file    Family History  Problem Relation Age of Onset  . Heart disease Father      Review of Systems  Constitutional: Negative for chills, fever and malaise/fatigue.  HENT: Positive for congestion, sinus pain and sore throat. Negative for ear pain and nosebleeds.   Eyes: Positive for discharge and redness.  Respiratory: Positive for cough. Negative for sputum production, shortness of breath and wheezing.   Cardiovascular: Negative for chest pain and palpitations.  Gastrointestinal: Negative for abdominal pain, diarrhea, nausea and vomiting.  Genitourinary: Negative for dysuria and hematuria.  Musculoskeletal: Negative for neck pain.  Skin: Negative for rash.  Neurological: Negative for dizziness and headaches.    Endo/Heme/Allergies: Negative.   All other systems reviewed and are negative.  Vitals:   05/27/17 1137  BP: 106/72  Pulse: 76  Resp: 16  Temp: 98.2 F (36.8 C)  SpO2: 98%     Physical Exam  Constitutional: He is oriented to person, place, and time. He appears well-developed and well-nourished.  HENT:  Head: Normocephalic and atraumatic.  Right Ear: Tympanic membrane, external ear and ear canal normal.  Left Ear: Tympanic membrane, external ear and ear canal normal.  Nose: Mucosal edema present. Right sinus exhibits maxillary sinus tenderness. Left sinus exhibits maxillary sinus tenderness.  Eyes: Conjunctivae and EOM are normal. Pupils are equal, round, and reactive to light.  Neck: Normal range of motion. Neck supple. No JVD present. No thyromegaly present.  Cardiovascular: Normal rate, regular rhythm and normal heart sounds.  Pulmonary/Chest: Effort normal and breath sounds normal. No respiratory distress.  Abdominal: Soft. Bowel sounds are normal. He exhibits no distension. There is no tenderness.  Musculoskeletal: Normal range of motion.  Lymphadenopathy:    He has no cervical adenopathy.  Neurological: He is alert and oriented to person, place, and time. No sensory deficit. He exhibits normal muscle tone.  Skin: Skin is warm and dry. Capillary refill takes less than 2 seconds. No rash noted.  Psychiatric: He has a normal mood and affect. His behavior is normal.  Vitals reviewed.    ASSESSMENT & PLAN: Ray RuizJohn was seen today for nasal congestion, eyes, cough and dry skin.  Diagnoses and all orders for this visit:  Sinus congestion  Cough  Sore throat  Acute upper respiratory infection  History of asthma  Skin lesions, generalized -     Ambulatory referral to Dermatology  Other orders -     amoxicillin-clavulanate (AUGMENTIN) 875-125 MG tablet; Take 1 tablet by mouth 2 (two) times daily for 7 days. -     predniSONE (DELTASONE) 20 MG tablet; Take 2 tablets (40  mg total) by mouth daily with breakfast for 5 days.    Patient Instructions       IF you received an x-ray today, you will receive an invoice from [facility]. Please contact [facility] at [phone] with questions or concerns regarding your invoice.   IF you received labwork today, you will receive an invoice from [facility]. Please contact [facility] at [phone] with questions or concerns regarding your invoice.   Our billing staff will not be able to assist you with questions regarding bills from these companies.  You will be contacted with the lab results as soon as they are available. The fastest way to get your results is to activate your My Chart account. Instructions are located on the last page of this paperwork. If you have not heard from us regarding the results in 2 weeks, please contact this office.     Upper Respiratory Infection, Adult Most upper respiratory infections (URIs) are caused by a virus. A URI affects the nose, throat, and upper air  passages. The most common type of URI is often called "the common cold." Follow these instructions at home:  Take medicines only as told by your doctor.  Gargle warm saltwater or take cough drops to comfort your throat as told by your doctor.  Use a warm mist humidifier or inhale steam from a shower to increase air moisture. This may make it easier to breathe.  Drink enough fluid to keep your pee (urine) clear or pale yellow.  Eat soups and other clear broths.  Have a healthy diet.  Rest as needed.  Go back to work when your fever is gone or your doctor says it is okay. ? You may need to stay home longer to avoid giving your URI to others. ? You can also wear a face mask and wash your hands often to prevent spread of the virus.  Use your inhaler more if you have asthma.  Do not use any tobacco products, including cigarettes, chewing tobacco, or electronic cigarettes. If you need help quitting, ask  your doctor. Contact a doctor if:  You are getting worse, not better.  Your symptoms are not helped by medicine.  You have chills.  You are getting more short of breath.  You have brown or red mucus.  You have yellow or brown discharge from your nose.  You have pain in your face, especially when you bend forward.  You have a fever.  You have puffy (swollen) neck glands.  You have pain while swallowing.  You have white areas in the back of your throat. Get help right away if:  You have very bad or constant: ? Headache. ? Ear pain. ? Pain in your forehead, behind your eyes, and over your cheekbones (sinus pain). ? Chest pain.  You have long-lasting (chronic) lung disease and any of the following: ? Wheezing. ? Long-lasting cough. ? Coughing up blood. ? A change in your usual mucus.  You have a stiff neck.  You have changes in your: ? Vision. ? Hearing. ? Thinking. ? Mood. This information is not intended to replace advice given to you by your health care provider. Make sure you discuss any questions you have with your health care provider. Document Released: 12/02/2007 Document Revised: 02/16/2016 Document Reviewed: 09/20/2013 Elsevier Interactive Patient Education  2018 Elsevier Inc.      Edwina BarthMiguel Zackory Pudlo, MD [facility]

## 2017-05-27 NOTE — Progress Notes (Signed)
[facility] Cancer Follow-up Visit:  Assessment: Neutropenia [facility](HCC) 41 y.o. male without any active symptoms presenting with history of thalassemia and leukopenia with predominance of neutropenia of unknown duration on the most recent lab work.  Patient has no evidence of active infections by symptoms or clinical examination.  Low white blood cell count may signify development of myelofibrosis as the patient's with diagnosis of thalassemia does suffer from inefficient erythropoiesis which puts increased strain on the bone marrow production capacity.  In addition, he has mild anemia with profound microcytosis and hypochromia consistent with beta thalassemia, but I would like to confirm the diagnosis by obtaining hemoglobin electrophoresis.  Repeat lab work demonstrates improving white blood cell count and stable hemoglobin.  Ferritin is elevated consistent with ineffective erythropoiesis attributable to thalassemia.  Elevation of LDH and ALT are not clearly explained at this point in time and may warrant additional evaluation.  Plan: --hemoglobin electrophoresis --Right upper quadrant ultrasound  --return to clinic in 3 months for continued monitoring of the hematological profile  Voice recognition software was used and creation of this note. Despite my best effort at editing the text, some misspelling/errors may have occurred.  Orders Placed This Encounter  Procedures  . Hemoglobinopathy evaluation    Standing Status:   Future    Number of Occurrences:   1    Standing Expiration Date:   05/13/2018  . CBC & Diff and Retic    Standing Status:   Future    Standing Expiration Date:   05/13/2018  . Comprehensive metabolic panel    Standing Status:   Future    Standing Expiration Date:   05/13/2018  . Lactate dehydrogenase (LDH)    Standing Status:   Future    Standing Expiration Date:   05/13/2018  . Ferritin    Standing Status:   Future    Standing Expiration Date:    05/13/2018    Cancer Staging No matching staging information was found for the patient.  All questions were answered.  . The patient knows to call the clinic with any problems, questions or concerns.  This note was electronically signed.    History of Presenting Illness Howard PouchJohn L Dimon is a 41 y.o. male followed in the Cancer [facility] for leukopenia evaluation referred By Dr Evie LacksMiguel J Sagardia.  Please see below for the available lab work.  Patient has a history of thalassemia that he has carried since his childhood.  He does not remember the type.  Otherwise, he denies any significant symptoms.  He denies any recurrent infections, fevers, chills, weight loss, appetite change, night sweats.  He denies any changes in activity tolerance.  No respiratory, gastrointestinal, or genitourinary complaints.  No skin lesions or neurological deficits.  Patient returns to the clinic to review labs obtained recently.  Patient denies any new complaints  Oncological/hematological History: --Labs, 03/03/17: WBC 2.5, ANC 1.1, ALC 1.0, Mono 0.4, Eos 0.0, Baso 0.0, Hgb 12.1 , MCV 62.0, MCH 19.5, RDW 17.7, Plt 181;  --Labs, 05/06/17: WBC 3.7, ANC 1.7, ALC 1.6, mono 0.4,                                Hgb 12.5, MCV 62.8, MCH 19.9, RDW     ...,  Plt 212; Fe 137, Fe Sat 41%, TIBC 338, Ferritin 483, CU 83, Ceruloplasmin 17.7; tBili 1.7, ALT 56, LDH 155    No history exists.  Medical History: Past Medical History:  Diagnosis Date  . Allergy   . Asthma   . Thalassemia     Surgical History: History reviewed. No pertinent surgical history.  Family History: Family History  Problem Relation Age of Onset  . Heart disease Father     Social History: Social History   Socioeconomic History  . Marital status: Single    Spouse name: Not on file  . Number of children: Not on file  . Years of education: Not on file  . Highest education level: Not on file  Social Needs  . Financial resource strain: Not  on file  . Food insecurity - worry: Not on file  . Food insecurity - inability: Not on file  . Transportation needs - medical: Not on file  . Transportation needs - non-medical: Not on file  Occupational History  . Not on file  Tobacco Use  . Smoking status: Never Smoker  . Smokeless tobacco: Never Used  Substance and Sexual Activity  . Alcohol use: Yes    Alcohol/week: 0.0 oz  . Drug use: No  . Sexual activity: Not on file  Other Topics Concern  . Not on file  Social History Narrative  . Not on file    Allergies: No Known Allergies  Medications:  Current Outpatient Medications  Medication Sig Dispense Refill  . albuterol (PROVENTIL HFA;VENTOLIN HFA) 108 (90 BASE) MCG/ACT inhaler Inhale 2 puffs into the lungs every 4 (four) hours as needed for wheezing or shortness of breath (cough, shortness of breath or wheezing.). (Patient not taking: Reported on 05/27/2017) 1 Inhaler 3  . amoxicillin-clavulanate (AUGMENTIN) 875-125 MG tablet Take 1 tablet by mouth 2 (two) times daily for 7 days. 14 tablet 0  . azithromycin (ZITHROMAX) 250 MG tablet Sig as indicated (Patient not taking: Reported on 05/27/2017) 6 tablet 0  . fluticasone (FLONASE) 50 MCG/ACT nasal spray Place into both nostrils daily.    . predniSONE (DELTASONE) 20 MG tablet Take 2 tablets (40 mg total) by mouth daily with breakfast for 5 days. 10 tablet 0  . promethazine-codeine (PHENERGAN WITH CODEINE) 6.25-10 MG/5ML syrup Take 5 mLs by mouth at bedtime as needed for cough. (Patient not taking: Reported on 05/27/2017) 120 mL 0  . Pseudoeph-Doxylamine-DM-APAP (NYQUIL PO) Take by mouth.    . traMADol (ULTRAM) 50 MG tablet Take 0.5-1 tablets (25-50 mg total) by mouth every 12 (twelve) hours as needed. (Patient not taking: Reported on 05/27/2017) 30 tablet 0   No current facility-administered medications for this visit.     Review of Systems: Review of Systems  All other systems reviewed and are negative.    PHYSICAL  EXAMINATION Blood pressure 125/75, pulse (!) 57, temperature 97.9 F (36.6 C), temperature source Oral, resp. rate 18, height 5' 8.5" (1.74 m), weight 173 lb 6.4 oz (78.7 kg), SpO2 100 %.  ECOG PERFORMANCE STATUS: 0 - Asymptomatic  Physical Exam  Constitutional: He is oriented to person, place, and time and well-developed, well-nourished, and in no distress. No distress.  HENT:  Head: Normocephalic and atraumatic.  Mouth/Throat: Oropharynx is clear and moist. No oropharyngeal exudate.  Eyes: Conjunctivae and EOM are normal. Pupils are equal, round, and reactive to light. No scleral icterus.  Neck: Normal range of motion. No thyromegaly present.  Cardiovascular: Normal rate, regular rhythm and normal heart sounds. Exam reveals no gallop.  No murmur heard. Pulmonary/Chest: Effort normal and breath sounds normal. No respiratory distress. He has no wheezes. He has no rales.  Abdominal:  Soft. Bowel sounds are normal. He exhibits no distension. There is no tenderness. There is no rebound and no guarding.  Musculoskeletal: Normal range of motion. He exhibits no edema.  Lymphadenopathy:    He has no cervical adenopathy.  Neurological: He is alert and oriented to person, place, and time. He has normal reflexes. No cranial nerve deficit.  Skin: Skin is warm and dry. No rash noted. He is not diaphoretic. No erythema. No pallor.     LABORATORY DATA: I have personally reviewed the data as listed: Appointment on 05/13/2017  Component Date Value Ref Range Status  . Hemoglobin F Quantitation 05/13/2017 1.2  0.0 - 2.0 % Final  . Hgb A 05/13/2017 93.9* 96.4 - 98.8 % Final  . HGB S 05/13/2017 0.0  0.0 % Final  . HGB C 05/13/2017 0.0  0.0 % Final  . Hemoglobin A2 Quantitation 05/13/2017 4.9* 1.8 - 3.2 % Final  . HGB VARIANT 05/13/2017 0.0  0.0 % Final  . HGB INTERPRETATION 05/13/2017 Comment   Final   Comment: Hemoglobin pattern and concentrations are consistent with beta- Thalassemia minor. Suggest  hematologic and clinical correlation.        Daisy Blossom, MD

## 2017-05-27 NOTE — Patient Instructions (Addendum)
     IF you received an x-ray today, you will receive an invoice from [facility]. Please contact [facility] at [phone] with questions or concerns regarding your invoice.   IF you received labwork today, you will receive an invoice from [facility]. Please contact [facility] at [phone] with questions or concerns regarding your invoice.   Our billing staff will not be able to assist you with questions regarding bills from these companies.  You will be contacted with the lab results as soon as they are available. The fastest way to get your results is to activate your My Chart account. Instructions are located on the last page of this paperwork. If you have not heard from us regarding the results in 2 weeks, please contact this office.     Upper Respiratory Infection, Adult Most upper respiratory infections (URIs) are caused by a virus. A URI affects the nose, throat, and upper air passages. The most common type of URI is often called "the common cold." Follow these instructions at home:  Take medicines only as told by your doctor.  Gargle warm saltwater or take cough drops to comfort your throat as told by your doctor.  Use a warm mist humidifier or inhale steam from a shower to increase air moisture. This may make it easier to breathe.  Drink enough fluid to keep your pee (urine) clear or pale yellow.  Eat soups and other clear broths.  Have a healthy diet.  Rest as needed.  Go back to work when your fever is gone or your doctor says it is okay. ? You may need to stay home longer to avoid giving your URI to others. ? You can also wear a face mask and wash your hands often to prevent spread of the virus.  Use your inhaler more if you have asthma.  Do not use any tobacco products, including cigarettes, chewing tobacco, or electronic cigarettes. If you need help quitting, ask your doctor. Contact a doctor if:  You are getting worse, not better.  Your  symptoms are not helped by medicine.  You have chills.  You are getting more short of breath.  You have brown or red mucus.  You have yellow or brown discharge from your nose.  You have pain in your face, especially when you bend forward.  You have a fever.  You have puffy (swollen) neck glands.  You have pain while swallowing.  You have white areas in the back of your throat. Get help right away if:  You have very bad or constant: ? Headache. ? Ear pain. ? Pain in your forehead, behind your eyes, and over your cheekbones (sinus pain). ? Chest pain.  You have long-lasting (chronic) lung disease and any of the following: ? Wheezing. ? Long-lasting cough. ? Coughing up blood. ? A change in your usual mucus.  You have a stiff neck.  You have changes in your: ? Vision. ? Hearing. ? Thinking. ? Mood. This information is not intended to replace advice given to you by your health care provider. Make sure you discuss any questions you have with your health care provider. Document Released: 12/02/2007 Document Revised: 02/16/2016 Document Reviewed: 09/20/2013 Elsevier Interactive Patient Education  2018 Elsevier Inc.  

## 2017-05-27 NOTE — Assessment & Plan Note (Signed)
41 y.o. male without any active symptoms presenting with history of thalassemia and leukopenia with predominance of neutropenia of unknown duration on the most recent lab work.  Patient has no evidence of active infections by symptoms or clinical examination.  Low white blood cell count may signify development of myelofibrosis as the patient's with diagnosis of thalassemia does suffer from inefficient erythropoiesis which puts increased strain on the bone marrow production capacity.  In addition, he has mild anemia with profound microcytosis and hypochromia consistent with beta thalassemia, but I would like to confirm the diagnosis by obtaining hemoglobin electrophoresis.  Repeat lab work demonstrates improving white blood cell count and stable hemoglobin.  Ferritin is elevated consistent with ineffective erythropoiesis attributable to thalassemia.  Elevation of LDH and ALT are not clearly explained at this point in time and may warrant additional evaluation.  Plan: --hemoglobin electrophoresis --Right upper quadrant ultrasound  --return to clinic in 3 months for continued monitoring of the hematological profile

## 2017-06-03 ENCOUNTER — Ambulatory Visit: Payer: 59 | Admitting: Physician Assistant

## 2017-06-03 ENCOUNTER — Encounter: Payer: Self-pay | Admitting: Physician Assistant

## 2017-06-03 ENCOUNTER — Other Ambulatory Visit: Payer: Self-pay

## 2017-06-03 VITALS — BP 122/60 | HR 72 | Temp 98.2°F | Resp 18 | Ht 68.9 in | Wt 175.0 lb

## 2017-06-03 DIAGNOSIS — R21 Rash and other nonspecific skin eruption: Secondary | ICD-10-CM | POA: Diagnosis not present

## 2017-06-03 DIAGNOSIS — T50905A Adverse effect of unspecified drugs, medicaments and biological substances, initial encounter: Secondary | ICD-10-CM

## 2017-06-03 MED ORDER — HYDROXYZINE HCL 25 MG PO TABS: 12.5000 mg | ORAL_TABLET | Freq: Three times a day (TID) | ORAL | 0 refills | Status: DC | PRN

## 2017-06-03 MED ORDER — RANITIDINE HCL 150 MG PO TABS: 150.0000 mg | ORAL_TABLET | Freq: Two times a day (BID) | ORAL | 0 refills | Status: DC

## 2017-06-03 MED ORDER — CETIRIZINE HCL 10 MG PO TABS: 10.0000 mg | ORAL_TABLET | Freq: Every day | ORAL | 0 refills | Status: DC

## 2017-06-03 MED ORDER — PREDNISONE 10 MG PO TABS: ORAL_TABLET | ORAL | 0 refills | Status: DC

## 2017-06-03 MED ORDER — METHYLPREDNISOLONE SODIUM SUCC 125 MG IJ SOLR
80.0000 mg | Freq: Once | INTRAMUSCULAR | Status: AC
  Administered 2017-06-03: 80 mg via INTRAMUSCULAR

## 2017-06-03 NOTE — Progress Notes (Deleted)
Subjective:     Ray PouchJohn L Wong is a 41 y.o. male who presents for evaluation of a rash involving the entire body. Rash started 2 days ago. Lesions are hive like and raised.  Rash has changed over time. Rash is pruritic. Associated symptoms: none. Patient denies: abdominal pain, arthralgia, decrease in appetite, decrease in energy level, fever, headache, irritability, myalgia, nausea, sore throat, vomiting and difficulty breathing and facial swelling. Patient has not had contacts with similar rash. Patient was exposed to augmentin 7 days ago. Was seen in office on 05/27/17 for sinus congestion. Given Rx for 7 days worth of augmentin and 5 days of prednisone. When he stopped the prednisone, the rash appeared. It has spread to entire body. Stopped augmentin about 24 hours ago.  No new (soaps, lotions, laundry detergents, foods, plants, insects or animals).  {Common ambulatory SmartLinks:19316}  Review of Systems {ros; complete:30496}    Objective:    BP 122/60 (BP Location: Left Arm, Patient Position: Sitting, Cuff Size: Normal)   Pulse 72   Temp 98.2 F (36.8 C) (Oral)   Resp 18   Ht 5' 8.9" (1.75 m)   Wt 175 lb (79.4 kg)   SpO2 99%   BMI 25.92 kg/m  General:  {gen appearance:16600}  Skin:  {skin exam:30902::"normal"}     Assessment:    {derm diagnosis:16511}    Plan:    {ZOXW:96045}{plan:18774}

## 2017-06-03 NOTE — Progress Notes (Signed)
Ray Wong  MRN: [id number] DOB: 12/22/1975  Subjective:  Ray Wong is a 41 y.o. male who presents for evaluation of a rash involving the entire body. Rash started 2 days ago. Lesions are hive like and raised.  Rash has changed over time. Rash is pruritic. Associated symptoms: none. Patient denies: trouble swallowing, difficulty breathing, SOB, facial swelling, abdominal pain, arthralgia, decrease in appetite, decrease in energy level, fever, headache, irritability, myalgia, nausea, sore throat, and vomiting. Patient has not had contacts with similar rash. Patient was exposed to augmentin 7 days ago. Was seen in office on 05/27/17 for sinus congestion. Given Rx for 7 days worth of augmentin and 5 days of prednisone. When he stopped the prednisone, the rash appeared. It has spread to entire body. Stopped augmentin about 24 hours ago.  No new soaps, lotions, laundry detergents, foods, plants, insects or animals.  He has not tried anything for relief.   Review of Systems  Constitutional: Negative for chills, diaphoresis and fever.  Cardiovascular: Negative for chest pain and palpitations.    Patient Active Problem List   Diagnosis Date Noted  . Sinus congestion 05/27/2017  . History of asthma 05/27/2017  . Thalassemia 05/27/2017  . History of cervical fracture 03/15/2017  . Neutropenia (HCC) 03/15/2017  . Routine general medical examination at a health care facility 03/03/2017  . Exercise-induced asthma 10/01/2016    Current Outpatient Medications on File Prior to Visit  Medication Sig Dispense Refill  . albuterol (PROVENTIL HFA;VENTOLIN HFA) 108 (90 BASE) MCG/ACT inhaler Inhale 2 puffs into the lungs every 4 (four) hours as needed for wheezing or shortness of breath (cough, shortness of breath or wheezing.). 1 Inhaler 3  . fluticasone (FLONASE) 50 MCG/ACT nasal spray Place into both nostrils daily.    . Pseudoeph-Doxylamine-DM-APAP (NYQUIL PO) Take by mouth.    Marland Kitchen.  amoxicillin-clavulanate (AUGMENTIN) 875-125 MG tablet Take 1 tablet by mouth 2 (two) times daily for 7 days. (Patient not taking: Reported on 06/03/2017) 14 tablet 0  . azithromycin (ZITHROMAX) 250 MG tablet Sig as indicated (Patient not taking: Reported on 05/27/2017) 6 tablet 0  . promethazine-codeine (PHENERGAN WITH CODEINE) 6.25-10 MG/5ML syrup Take 5 mLs by mouth at bedtime as needed for cough. (Patient not taking: Reported on 05/27/2017) 120 mL 0  . traMADol (ULTRAM) 50 MG tablet Take 0.5-1 tablets (25-50 mg total) by mouth every 12 (twelve) hours as needed. (Patient not taking: Reported on 05/27/2017) 30 tablet 0   No current facility-administered medications on file prior to visit.     No Known Allergies   Objective:  BP 122/60 (BP Location: Left Arm, Patient Position: Sitting, Cuff Size: Normal)   Pulse 72   Temp 98.2 F (36.8 C) (Oral)   Resp 18   Ht 5' 8.9" (1.75 m)   Wt 175 lb (79.4 kg)   SpO2 99%   BMI 25.92 kg/m   Physical Exam  Constitutional: He is oriented to person, place, and time and well-developed, well-nourished, and in no distress.  HENT:  Head: Normocephalic and atraumatic.  Mouth/Throat: Uvula is midline, oropharynx is clear and moist and mucous membranes are normal.  Eyes: Conjunctivae are normal.  Neck: Normal range of motion.  Pulmonary/Chest: Effort normal. He has no wheezes. He has no rhonchi. He has no rales.  Neurological: He is alert and oriented to person, place, and time. Gait normal.  Skin: Skin is warm and dry. Rash (Diffuse maculopapular rash noted on head, neck, trunk, upper extremities, and lower  extremities) noted.  Psychiatric: Affect normal.  Vitals reviewed.   Assessment and Plan :   1. Rash and nonspecific skin eruption 2. Adverse effect of drug, initial encounter Rash consistent with drug reaction to Augmentin.  Patient is well-appearing.  Vital stable.  Patient is not having any trouble swallowing ,difficulty breathing, facial  swelling, or shortness of breath.  He has already discontinued Augmentin. Given steroid injection in office.  Given Rx for H1 blocker, H2 blocker, and prednisone taper.  I have updated patient's allergies to include Augmentin.  Patient advised to return to clinic if symptoms worsen, do not improve, or as needed. - methylPREDNISolone sodium succinate (SOLU-MEDROL) 125 mg/2 mL injection 80 mg - cetirizine (ZYRTEC) 10 MG tablet; Take 1 tablet (10 mg total) by mouth daily.  Dispense: 30 tablet; Refill: 0 - ranitidine (ZANTAC) 150 MG tablet; Take 1 tablet (150 mg total) by mouth 2 (two) times daily.  Dispense: 60 tablet; Refill: 0 - hydrOXYzine (ATARAX/VISTARIL) 25 MG tablet; Take 0.5-1 tablets (12.5-25 mg total) by mouth every 8 (eight) hours as needed for itching.  Dispense: 30 tablet; Refill: 0 - predniSONE (DELTASONE) 10 MG tablet; 6-5-4-3-2-1 taper. Take all the tablets for that day in the am with food.  Dispense: 21 tablet; Refill: 0  Benjiman CoreBrittany Joyceann Kruser PA-C  [facility] at [facility]  [facility] 06/03/2017 11:07 AM

## 2017-06-03 NOTE — Patient Instructions (Addendum)
We are going to treat your rash with prednisone injection.  You are going to continue a prednisone taper starting tomorrow.  Prednisone is a steroid and can cause side effects such as headache, irritability, nausea, vomiting, increased heart rate, increased blood pressure, increased blood sugar, appetite changes, and insomnia. Please take tablets in the morning with a full meal to help decrease the chances of these side effects. We have also given you a prescription for oral H1 and H2 blocker.  Please take Zyrtec and Zantac daily.  You may use hydroxyzine at night for itching.  Just keep in mind hydroxyzine may make you feel drowsy. You should start noticing significant improvement in the next 24-48 hours. Please return if any of your symptoms worsen or you develop new concerning symptoms like difficulty breathing, facial swelling, or trouble swallowing.   Rash A rash is a change in the color of the skin. A rash can also change the way your skin feels. There are many different conditions and factors that can cause a rash. Follow these instructions at home: Pay attention to any changes in your symptoms. Follow these instructions to help with your condition: Medicine Take or apply over-the-counter and prescription medicines only as told by your doctor. These may include:  Corticosteroid cream.  Anti-itch lotions.  Oral antihistamines.  Skin Care  Put cool compresses on the affected areas.  Try taking a bath with: ? Epsom salts. Follow the instructions on the packaging. You can get these at your local pharmacy or grocery store. ? Baking soda. Pour a small amount into the bath as told by your doctor. ? Colloidal oatmeal. Follow the instructions on the packaging. You can get this at your local pharmacy or grocery store.  Try putting baking soda paste onto your skin. Stir water into baking soda until it gets like a paste.  Do not scratch or rub your skin.  Avoid covering the rash. Make sure  the rash is exposed to air as much as possible. General instructions  Avoid hot showers or baths, which can make itching worse. A cold shower may help.  Avoid scented soaps, detergents, and perfumes. Use gentle soaps, detergents, perfumes, and other cosmetic products.  Avoid anything that causes your rash. Keep a journal to help track what causes your rash. Write down: ? What you eat. ? What cosmetic products you use. ? What you drink. ? What you wear. This includes jewelry.  Keep all follow-up visits as told by your doctor. This is important. Contact a doctor if:  You sweat at night.  You lose weight.  You pee (urinate) more than normal.  You feel weak.  You throw up (vomit).  Your skin or the whites of your eyes look yellow (jaundice).  Your skin: ? Tingles. ? Is numb.  Your rash: ? Does not go away after a few days. ? Gets worse.  You are: ? More thirsty than normal. ? More tired than normal.  You have: ? New symptoms. ? Pain in your belly (abdomen). ? A fever. ? Watery poop (diarrhea). Get help right away if:  Your rash covers all or most of your body. The rash may or may not be painful.  You have blisters that: ? Are on top of the rash. ? Grow larger. ? Grow together. ? Are painful. ? Are inside your nose or mouth.  You have a rash that: ? Looks like purple pinprick-sized spots all over your body. ? Has a "bull's eye" or looks  like a target. ? Is red and painful, causes your skin to peel, and is not from being in the sun too long. This information is not intended to replace advice given to you by your health care provider. Make sure you discuss any questions you have with your health care provider. Document Released: 12/02/2007 Document Revised: 11/21/2015 Document Reviewed: 10/31/2014 Elsevier Interactive Patient Education  2018 [employer]    IF you received an x-ray today, you will receive an invoice from [facility]. Please  contact [facility] at [phone] with questions or concerns regarding your invoice.   IF you received labwork today, you will receive an invoice from [facility]. Please contact [facility] at [phone] with questions or concerns regarding your invoice.   Our billing staff will not be able to assist you with questions regarding bills from these companies.  You will be contacted with the lab results as soon as they are available. The fastest way to get your results is to activate your My Chart account. Instructions are located on the last page of this paperwork. If you have not heard from us regarding the results in 2 weeks, please contact this office.

## 2017-06-04 ENCOUNTER — Telehealth: Payer: Self-pay | Admitting: Physician Assistant

## 2017-06-04 NOTE — Telephone Encounter (Signed)
Pt called regarding rash that he has. He stated that he had been at [facility] and saw a provider that prescribed meds for the rash and itching. He had started these meds last evening and felt like they were not working. He wanted to know if he could take Benadryl along with the Hydroxyzine for the itching. Pt advised not to take them together. Advised him to cont to take his meds for the next 24 hour and if he is not better in the morning to go back to [place] to be seen. Called patient back and told him that we would need to schedule an appointment. So he is going to wait and see if the medicine is working. Will call if he feels like he is getting worse.

## 2017-06-10 ENCOUNTER — Ambulatory Visit: Payer: 59 | Admitting: Physician Assistant

## 2017-06-10 ENCOUNTER — Encounter: Payer: Self-pay | Admitting: Physician Assistant

## 2017-06-10 DIAGNOSIS — R21 Rash and other nonspecific skin eruption: Secondary | ICD-10-CM

## 2017-06-10 MED ORDER — PREDNISONE 10 MG PO TABS: ORAL_TABLET | ORAL | 0 refills | Status: DC

## 2017-06-10 MED ORDER — DIPHENHYDRAMINE HCL 25 MG PO CAPS: 25.0000 mg | ORAL_CAPSULE | Freq: Four times a day (QID) | ORAL | 0 refills | Status: DC | PRN

## 2017-06-10 MED ORDER — RANITIDINE HCL 150 MG PO TABS: 150.0000 mg | ORAL_TABLET | Freq: Two times a day (BID) | ORAL | 0 refills | Status: DC

## 2017-06-10 NOTE — Patient Instructions (Addendum)
For your rash, we are going to extend your prednisone by an additional taper.  Prednisone is a steroid and can cause side effects such as headache, irritability, nausea, vomiting, increased heart rate, increased blood pressure, increased blood sugar, appetite changes, and insomnia. Please take tablets in the morning with a full meal to help decrease the chances of these side effects. For itching, I would like you to try benedryl every 6-8 hours. Also take ranitidine. Please return to clinic if symptoms worsen, do not improve, or as needed   Rash A rash is a change in the color of the skin. A rash can also change the way your skin feels. There are many different conditions and factors that can cause a rash. Follow these instructions at home: Pay attention to any changes in your symptoms. Follow these instructions to help with your condition: Medicine Take or apply over-the-counter and prescription medicines only as told by your doctor. These may include:  Corticosteroid cream.  Anti-itch lotions.  Oral antihistamines.  Skin Care  Put cool compresses on the affected areas.  Try taking a bath with: ? Epsom salts. Follow the instructions on the packaging. You can get these at your local pharmacy or grocery store. ? Baking soda. Pour a small amount into the bath as told by your doctor. ? Colloidal oatmeal. Follow the instructions on the packaging. You can get this at your local pharmacy or grocery store.  Try putting baking soda paste onto your skin. Stir water into baking soda until it gets like a paste.  Do not scratch or rub your skin.  Avoid covering the rash. Make sure the rash is exposed to air as much as possible. General instructions  Avoid hot showers or baths, which can make itching worse. A cold shower may help.  Avoid scented soaps, detergents, and perfumes. Use gentle soaps, detergents, perfumes, and other cosmetic products.  Avoid anything that causes your rash. Keep a  journal to help track what causes your rash. Write down: ? What you eat. ? What cosmetic products you use. ? What you drink. ? What you wear. This includes jewelry.  Keep all follow-up visits as told by your doctor. This is important. Contact a doctor if:  You sweat at night.  You lose weight.  You pee (urinate) more than normal.  You feel weak.  You throw up (vomit).  Your skin or the whites of your eyes look yellow (jaundice).  Your skin: ? Tingles. ? Is numb.  Your rash: ? Does not go away after a few days. ? Gets worse.  You are: ? More thirsty than normal. ? More tired than normal.  You have: ? New symptoms. ? Pain in your belly (abdomen). ? A fever. ? Watery poop (diarrhea). Get help right away if:  Your rash covers all or most of your body. The rash may or may not be painful.  You have blisters that: ? Are on top of the rash. ? Grow larger. ? Grow together. ? Are painful. ? Are inside your nose or mouth.  You have a rash that: ? Looks like purple pinprick-sized spots all over your body. ? Has a "bull's eye" or looks like a target. ? Is red and painful, causes your skin to peel, and is not from being in the sun too long. This information is not intended to replace advice given to you by your health care provider. Make sure you discuss any questions you have with your health care provider.  Document Released: 12/02/2007 Document Revised: 11/21/2015 Document Reviewed: 10/31/2014 Elsevier Interactive Patient Education  2018 [employer]

## 2017-06-10 NOTE — Progress Notes (Signed)
     IF you received an x-ray today, you will receive an invoice from [facility]. Please contact  Radiology at [phone] with questions or concerns regarding your invoice.   IF you received labwork today, you will receive an invoice from [facility]. Please contact [facility] at [phone] with questions or concerns regarding your invoice.   Our billing staff will not be able to assist you with questions regarding bills from these companies.  You will be contacted with the lab results as soon as they are available. The fastest way to get your results is to activate your My Chart account. Instructions are located on the last page of this paperwork. If you have not heard from us regarding the results in 2 weeks, please contact this office.     

## 2017-06-10 NOTE — Progress Notes (Signed)
Ray Wong  MRN: [id number] DOB: 02/03/1976  Subjective:  Ray Wong is a 41 y.o. healthy male seen in office today for a chief complaint of follow up on rash. Pt initially evaluated by me on 06/03/17 for drug reaction to Augmentin.  He had discontinued at the Augmentin at the time of the visit.  Had a diffuse rash from head to toe.  He was given a steroid injection in office.  Placed on prednisone taper, H1 blocker, and H2 blocker.  Please refer to that office visit note for further details.  Patient has completed all of his medication.  Notes the rash has improved but the itching has continued.  The rash is still present on bilateral arms.  The itching is present all throughout the day, worse in the mornings.  In terms of medication, he has tried Zyrtec, Allegra, and hydroxyzine.  Was wondering if he could try Benadryl.  Notes the only thing that gives him relief for the itching is taking a shower with hot water and then turning it to cold water.  Denies any fever, chills, nausea, vomiting, no joint pain, headache, irritability, and vomiting.  Review of Systems  Per HPI  Patient Active Problem List   Diagnosis Date Noted  . Sinus congestion 05/27/2017  . History of asthma 05/27/2017  . Thalassemia 05/27/2017  . History of cervical fracture 03/15/2017  . Neutropenia (HCC) 03/15/2017  . Routine general medical examination at a health care facility 03/03/2017  . Exercise-induced asthma 10/01/2016    Current Outpatient Medications on File Prior to Visit  Medication Sig Dispense Refill  . cetirizine (ZYRTEC) 10 MG tablet Take 1 tablet (10 mg total) by mouth daily. 30 tablet 0  . fluticasone (FLONASE) 50 MCG/ACT nasal spray Place into both nostrils daily.    . hydrOXYzine (ATARAX/VISTARIL) 25 MG tablet Take 0.5-1 tablets (12.5-25 mg total) by mouth every 8 (eight) hours as needed for itching. 30 tablet 0  . albuterol (PROVENTIL HFA;VENTOLIN HFA) 108 (90 BASE) MCG/ACT inhaler Inhale 2  puffs into the lungs every 4 (four) hours as needed for wheezing or shortness of breath (cough, shortness of breath or wheezing.). (Patient not taking: Reported on 06/10/2017) 1 Inhaler 3   No current facility-administered medications on file prior to visit.     Allergies  Allergen Reactions  . Augmentin [Amoxicillin-Pot Clavulanate] Rash    Diffuse maculopapular rash.     Objective:  BP 118/72 (BP Location: Right Arm, Patient Position: Sitting, Cuff Size: Normal)   Pulse 72   Temp 98.2 F (36.8 C) (Oral)   Resp 16   Ht 5' 8.9" (1.75 m)   Wt 173 lb 12.8 oz (78.8 kg)   SpO2 98%   BMI 25.74 kg/m   Physical Exam  Constitutional: He is oriented to person, place, and time and well-developed, well-nourished, and in no distress.  HENT:  Head: Normocephalic and atraumatic.  Eyes: Conjunctivae are normal.  Neck: Normal range of motion.  Pulmonary/Chest: Effort normal.  Neurological: He is alert and oriented to person, place, and time. Gait normal.  Skin: Skin is warm and dry. Rash (Erythematous maculopapular rash noted on bilateral upper extremities ) noted.  Psychiatric: Affect normal.  Vitals reviewed.   Assessment and Plan :  This case was precepted with Dr. Creta LevinStallings. 1. Rash and nonspecific skin eruption Rash has improved drastically since initial visit on 06/03/17.  Still present on bilateral arms.  Pruritus is patient's main concern.  Recommended extending prednisone taper for  an additional 6 days due coverage on bilateral arms.  Recommended trial of Benadryl for pruritus.  Patient educated to not use Zyrtec, Benadryl, and hydroxyzine at the same time as they are all H1 blockers. Can continue with ranitidine along with benedryl.  Patient would like to hold off on prednisone taper at this time.  Notes he is going to try the Benadryl and topical hydrocortisone cream on the areas that itch the most on his arms for at least the next 2 days.  Will start the prednisone course after 2  days if no improvement with Benadryl and hydrocortisone cream.  Pt educated to return to clinic if symptoms worsen, do not improve, or as needed. - predniSONE (DELTASONE) 10 MG tablet; 6-5-4-3-2-1 taper. Take all the tablets for that day in the am with food.  Dispense: 21 tablet; Refill: 0 - diphenhydrAMINE (BENADRYL) 25 mg capsule; Take 1 capsule (25 mg total) by mouth every 6 (six) hours as needed.  Dispense: 30 capsule; Refill: 0  Ray CoreBrittany Zabian Swayne PA-C  [facility] at [facility]  [facility] 06/10/2017 8:39 AM

## 2017-08-13 ENCOUNTER — Inpatient Hospital Stay: Payer: 59 | Attending: Hematology and Oncology | Admitting: Hematology and Oncology

## 2017-08-13 ENCOUNTER — Encounter: Payer: Self-pay | Admitting: Hematology and Oncology

## 2017-08-13 ENCOUNTER — Telehealth: Payer: Self-pay

## 2017-08-13 ENCOUNTER — Inpatient Hospital Stay: Payer: 59

## 2017-08-13 VITALS — BP 125/84 | HR 58 | Temp 99.0°F | Resp 18 | Ht 68.9 in | Wt 174.7 lb

## 2017-08-13 DIAGNOSIS — D709 Neutropenia, unspecified: Secondary | ICD-10-CM | POA: Insufficient documentation

## 2017-08-13 DIAGNOSIS — Z79899 Other long term (current) drug therapy: Secondary | ICD-10-CM | POA: Diagnosis not present

## 2017-08-13 DIAGNOSIS — R7989 Other specified abnormal findings of blood chemistry: Secondary | ICD-10-CM | POA: Diagnosis not present

## 2017-08-13 DIAGNOSIS — D561 Beta thalassemia: Secondary | ICD-10-CM | POA: Diagnosis present

## 2017-08-13 DIAGNOSIS — R718 Other abnormality of red blood cells: Secondary | ICD-10-CM

## 2017-08-13 LAB — CBC WITH DIFFERENTIAL (CANCER CENTER ONLY)
Basophils Absolute: 0 10*3/uL (ref 0.0–0.1)
Basophils Relative: 1 %
Eosinophils Absolute: 0.1 10*3/uL (ref 0.0–0.5)
Eosinophils Relative: 2 %
HCT: 41.2 % (ref 38.4–49.9)
HEMOGLOBIN: 13.1 g/dL (ref 13.0–17.1)
LYMPHS PCT: 40 %
Lymphs Abs: 1.6 10*3/uL (ref 0.9–3.3)
MCH: 19.9 pg — ABNORMAL LOW (ref 27.2–33.4)
MCHC: 31.8 g/dL — AB (ref 32.0–36.0)
MCV: 62.7 fL — AB (ref 79.3–98.0)
MONOS PCT: 7 %
Monocytes Absolute: 0.3 10*3/uL (ref 0.1–0.9)
NEUTROS PCT: 50 %
Neutro Abs: 2 10*3/uL (ref 1.5–6.5)
Platelet Count: 201 10*3/uL (ref 140–400)
RBC: 6.57 MIL/uL — AB (ref 4.20–5.82)
RDW: 15.5 % — ABNORMAL HIGH (ref 11.0–14.6)
WBC Count: 4 10*3/uL (ref 4.0–10.3)

## 2017-08-13 LAB — COMPREHENSIVE METABOLIC PANEL
ALT: 21 U/L (ref 0–55)
AST: 17 U/L (ref 5–34)
Albumin: 4.6 g/dL (ref 3.5–5.0)
Alkaline Phosphatase: 70 U/L (ref 40–150)
Anion gap: 8 (ref 3–11)
BUN: 18 mg/dL (ref 7–26)
CHLORIDE: 103 mmol/L (ref 98–109)
CO2: 27 mmol/L (ref 22–29)
CREATININE: 1.25 mg/dL (ref 0.70–1.30)
Calcium: 9.8 mg/dL (ref 8.4–10.4)
GFR calc Af Amer: >60 mL/min (ref >60)
Glucose, Bld: 88 mg/dL (ref 70–140)
Potassium: 4.6 mmol/L (ref 3.5–5.1)
Sodium: 138 mmol/L (ref 136–145)
Total Bilirubin: 1.5 mg/dL — ABNORMAL HIGH (ref 0.2–1.2)
Total Protein: 7.2 g/dL (ref 6.4–8.3)

## 2017-08-13 LAB — RETICULOCYTES
RBC.: 6.57 MIL/uL — ABNORMAL HIGH (ref 4.20–5.82)
Retic Count, Absolute: 118.3 10*3/uL — ABNORMAL HIGH (ref 34.8–93.9)
Retic Ct Pct: 1.8 % (ref 0.8–1.8)

## 2017-08-13 LAB — LACTATE DEHYDROGENASE: LDH: 146 U/L (ref 125–245)

## 2017-08-13 LAB — FERRITIN: FERRITIN: 367 ng/mL — AB (ref 22–316)

## 2017-08-13 NOTE — Telephone Encounter (Signed)
Printed avs and calender for upcoming appointment. Per 2/15 los 

## 2017-09-05 DIAGNOSIS — R7989 Other specified abnormal findings of blood chemistry: Secondary | ICD-10-CM | POA: Insufficient documentation

## 2017-09-05 NOTE — Progress Notes (Signed)
[facility] Follow-up Visit:  Assessment: Thalassemia 42 y.o. male without any active symptoms presenting with history of thalassemia and leukopenia with predominance of neutropenia of unknown duration on the most recent lab work.  Repeat blood work demonstrates interval resolution of leukopenia with recovery of neutrophil count normal values.  Patient's hemoglobin has also improved.  We have confirmed presence of beta thalassemia minor based on the hemoglobin electrophoresis outlined below.  In this situation, despite severe microcytosis, patient is not iron deficient as indicated by his ferritin values which are significantly above normal.  Thalassemia is characterized by ineffective erythropoiesis with tendency to accumulate iron.  Patient's total bilirubin is elevated, but at the present time his transaminases are within normal ranges.  Plan: -Continue observation.  If elevation of transaminases as noted in the future, will need to obtain MRI of the liver to assess for iron overload -Return to my clinic in 1 year with labs for continued hematological monitoring.  Voice recognition software was used and creation of this note. Despite my best effort at editing the text, some misspelling/errors may have occurred.  Orders Placed This Encounter  Procedures  . CBC with Differential (Cancer Center Only)    Standing Status:   Future    Standing Expiration Date:   08/13/2018  . CMP ([facility] only)    Standing Status:   Future    Standing Expiration Date:   08/13/2018  . Lactate dehydrogenase (LDH)    Standing Status:   Future    Standing Expiration Date:   08/13/2018  . Iron and TIBC    Standing Status:   Future    Standing Expiration Date:   08/13/2018  . Ferritin    Standing Status:   Future    Standing Expiration Date:   08/13/2018    Cancer Staging No matching staging information was found for the patient.  All questions were answered.  . The patient knows to  call the clinic with any problems, questions or concerns.  This note was electronically signed.    History of Presenting Illness Ray Wong is a 42 y.o. male followed in the [facility] for leukopenia evaluation referred By Dr Davina Poke.  Please see below for the available lab work.  Patient has a history of thalassemia that he has carried since his childhood.  He does not remember the type.  Otherwise, he denies any significant symptoms.  He denies any recurrent infections, fevers, chills, weight loss, appetite change, night sweats.  He denies any changes in activity tolerance.  No respiratory, gastrointestinal, or genitourinary complaints.  No skin lesions or neurological deficits.  Patient returns to the clinic to review labs obtained recently.  In December, patient had a body rash episode following treatment with Augmentin.  The rash has resolved completely following administration of steroids and antihistamines.  At the present time, patient denies any new complaints.   Oncological/hematological History: --Labs, 03/03/17: WBC 2.5, ANC 1.1, ALC 1.0, Mono 0.4, Eos 0.0, Baso 0.0, Hgb 12.1, MCV 62.0, MCH 19.5,        RDW 17.7, Plt 181;  --Labs, 05/06/17: WBC 3.7, ANC 1.7, ALC 1.6, Mono 0.4,                                Hgb 12.5, MCV 62.8, MCH 19.9,        RDW     ..., Plt 212; Fe 137, Fe  Sat 41%, TIBC 338, Ferritin 483, CU 83, Ceruloplasmin 17.7; tBili 1.7, ALT 56, LDH 155 --Hgb EP, 05/06/17: Hgb A 93.9%, Hgb A2 4.9%, Hgb F 1.2%, No Hgb S, C, Var; --Labs, 08/13/17: WBC 4.0, ANC 2.0, ALC 1.6, Mono 0.3,        Hgb 13.1, MCV 62.7, MCH 19.9, MCHC 31.8, RDW 15.5, Plt 201;          Ferritin 367;     tBili 1.5, AP 70, AST 17, ALT 21, LDH 146    No history exists.    Medical History: Past Medical History:  Diagnosis Date  . Allergy   . Asthma   . Thalassemia     Surgical History: History reviewed. No pertinent surgical history.  Family History: Family History  Problem  Relation Age of Onset  . Heart disease Father     Social History: Social History   Socioeconomic History  . Marital status: Single    Spouse name: Not on file  . Number of children: Not on file  . Years of education: Not on file  . Highest education level: Not on file  Social Needs  . Financial resource strain: Not on file  . Food insecurity - worry: Not on file  . Food insecurity - inability: Not on file  . Transportation needs - medical: Not on file  . Transportation needs - non-medical: Not on file  Occupational History  . Not on file  Tobacco Use  . Smoking status: Never Smoker  . Smokeless tobacco: Never Used  Substance and Sexual Activity  . Alcohol use: Yes    Alcohol/week: 0.0 oz  . Drug use: No  . Sexual activity: Not on file  Other Topics Concern  . Not on file  Social History Narrative  . Not on file    Allergies: Allergies  Allergen Reactions  . Augmentin [Amoxicillin-Pot Clavulanate] Rash    Diffuse maculopapular rash.    Medications:  Current Outpatient Medications  Medication Sig Dispense Refill  . fluticasone (FLONASE) 50 MCG/ACT nasal spray Place into both nostrils daily.    Marland Kitchen albuterol (PROVENTIL HFA;VENTOLIN HFA) 108 (90 BASE) MCG/ACT inhaler Inhale 2 puffs into the lungs every 4 (four) hours as needed for wheezing or shortness of breath (cough, shortness of breath or wheezing.). (Patient not taking: Reported on 06/10/2017) 1 Inhaler 3  . cetirizine (ZYRTEC) 10 MG tablet Take 1 tablet (10 mg total) by mouth daily. (Patient not taking: Reported on 08/13/2017) 30 tablet 0  . diphenhydrAMINE (BENADRYL) 25 mg capsule Take 1 capsule (25 mg total) by mouth every 6 (six) hours as needed. (Patient not taking: Reported on 08/13/2017) 30 capsule 0  . hydrOXYzine (ATARAX/VISTARIL) 25 MG tablet Take 0.5-1 tablets (12.5-25 mg total) by mouth every 8 (eight) hours as needed for itching. (Patient not taking: Reported on 08/13/2017) 30 tablet 0  . predniSONE  (DELTASONE) 10 MG tablet 6-5-4-3-2-1 taper. Take all the tablets for that day in the am with food. (Patient not taking: Reported on 08/13/2017) 21 tablet 0   No current facility-administered medications for this visit.     Review of Systems: Review of Systems  Skin: Positive for rash.  All other systems reviewed and are negative.    PHYSICAL EXAMINATION Blood pressure 125/84, pulse (!) 58, temperature 99 F (37.2 C), temperature source Oral, resp. rate 18, height 5' 8.9" (1.75 m), weight 174 lb 11.2 oz (79.2 kg), SpO2 100 %.  ECOG PERFORMANCE STATUS: 0 - Asymptomatic  Physical Exam  Constitutional:  He is oriented to person, place, and time and well-developed, well-nourished, and in no distress. No distress.  HENT:  Head: Normocephalic and atraumatic.  Mouth/Throat: Oropharynx is clear and moist. No oropharyngeal exudate.  Eyes: Conjunctivae and EOM are normal. Pupils are equal, round, and reactive to light. No scleral icterus.  Neck: Normal range of motion. No thyromegaly present.  Cardiovascular: Normal rate, regular rhythm and normal heart sounds. Exam reveals no gallop.  No murmur heard. Pulmonary/Chest: Effort normal and breath sounds normal. No respiratory distress. He has no wheezes. He has no rales.  Abdominal: Soft. Bowel sounds are normal. He exhibits no distension. There is no tenderness. There is no rebound and no guarding.  Musculoskeletal: Normal range of motion. He exhibits no edema.  Lymphadenopathy:    He has no cervical adenopathy.  Neurological: He is alert and oriented to person, place, and time. He has normal reflexes. No cranial nerve deficit.  Skin: Skin is warm and dry. No rash noted. He is not diaphoretic. No erythema. No pallor.     LABORATORY DATA: I have personally reviewed the data as listed: Appointment on 08/13/2017  Component Date Value Ref Range Status  . Sodium 08/13/2017 138  136 - 145 mmol/L Final  . Potassium 08/13/2017 4.6  3.5 - 5.1 mmol/L  Final  . Chloride 08/13/2017 103  98 - 109 mmol/L Final  . CO2 08/13/2017 27  22 - 29 mmol/L Final  . Glucose, Bld 08/13/2017 88  70 - 140 mg/dL Final  . BUN 08/13/2017 18  7 - 26 mg/dL Final  . Creatinine, Ser 08/13/2017 1.25  0.70 - 1.30 mg/dL Final  . Calcium 08/13/2017 9.8  8.4 - 10.4 mg/dL Final  . Total Protein 08/13/2017 7.2  6.4 - 8.3 g/dL Final  . Albumin 08/13/2017 4.6  3.5 - 5.0 g/dL Final  . AST 08/13/2017 17  5 - 34 U/L Final  . ALT 08/13/2017 21  0 - 55 U/L Final  . Alkaline Phosphatase 08/13/2017 70  40 - 150 U/L Final  . Total Bilirubin 08/13/2017 1.5* 0.2 - 1.2 mg/dL Final  . GFR calc non Af Amer 08/13/2017 >60  >60 mL/min Final  . GFR calc Af Amer 08/13/2017 >60  >60 mL/min Final   Comment: (NOTE) The eGFR has been calculated using the CKD EPI equation. This calculation has not been validated in all clinical situations. eGFR's persistently <60 mL/min signify possible Chronic Kidney Disease.   Georgiann Hahn gap 08/13/2017 8  3 - 11 Final   Performed at [facility], [place] [address] [place], [place] [address]  . LDH 08/13/2017 146  125 - 245 U/L Final   Performed at [facility], [place] [address], [place], [place] [address]  . Ferritin 08/13/2017 367* 22 - 316 ng/mL Final   Performed at [facility], [place] [address], [place], [place] [address]  . WBC Count 08/13/2017 4.0  4.0 - 10.3 K/uL Final  . RBC 08/13/2017 6.57* 4.20 - 5.82 MIL/uL Final  . Hemoglobin 08/13/2017 13.1  13.0 - 17.1 g/dL Final  . HCT 08/13/2017 41.2  38.4 - 49.9 % Final  . MCV 08/13/2017 62.7* 79.3 - 98.0 fL Final  . MCH 08/13/2017 19.9* 27.2 - 33.4 pg Final  . MCHC 08/13/2017 31.8* 32.0 - 36.0 g/dL Final  . RDW 08/13/2017 15.5* 11.0 - 14.6 % Final  . Platelet Count 08/13/2017 201  140 - 400 K/uL Final  . Neutrophils Relative %  08/13/2017 50  % Final  . Neutro Abs 08/13/2017 2.0  1.5 - 6.5 K/uL Final  . Lymphocytes Relative  08/13/2017 40  % Final  . Lymphs Abs 08/13/2017 1.6  0.9 - 3.3 K/uL Final  . Monocytes Relative 08/13/2017 7  % Final  . Monocytes Absolute 08/13/2017 0.3  0.1 - 0.9 K/uL Final  . Eosinophils Relative 08/13/2017 2  % Final  . Eosinophils Absolute 08/13/2017 0.1  0.0 - 0.5 K/uL Final  . Basophils Relative 08/13/2017 1  % Final  . Basophils Absolute 08/13/2017 0.0  0.0 - 0.1 K/uL Final   Performed at [facility], [place] [address], [place], [place] [address]  . Retic Ct Pct 08/13/2017 1.8  0.8 - 1.8 % Final  . RBC. 08/13/2017 6.57* 4.20 - 5.82 MIL/uL Final  . Retic Count, Absolute 08/13/2017 118.3* 34.8 - 93.9 K/uL Final   Performed at [facility], [place] [address] [place], [place] [address]       Ardath Sax, MD

## 2017-09-05 NOTE — Assessment & Plan Note (Signed)
42 y.o. male without any active symptoms presenting with history of thalassemia and leukopenia with predominance of neutropenia of unknown duration on the most recent lab work.  Repeat blood work demonstrates interval resolution of leukopenia with recovery of neutrophil count normal values.  Patient's hemoglobin has also improved.  We have confirmed presence of beta thalassemia minor based on the hemoglobin electrophoresis outlined below.  In this situation, despite severe microcytosis, patient is not iron deficient as indicated by his ferritin values which are significantly above normal.  Thalassemia is characterized by ineffective erythropoiesis with tendency to accumulate iron.  Patient's total bilirubin is elevated, but at the present time his transaminases are within normal ranges.  Plan: -Continue observation.  If elevation of transaminases as noted in the future, will need to obtain MRI of the liver to assess for iron overload -Return to my clinic in 1 year with labs for continued hematological monitoring.

## 2017-11-29 ENCOUNTER — Encounter: Payer: Self-pay | Admitting: Physician Assistant

## 2017-11-29 ENCOUNTER — Other Ambulatory Visit: Payer: Self-pay

## 2017-11-29 ENCOUNTER — Ambulatory Visit: Payer: 59 | Admitting: Physician Assistant

## 2017-11-29 VITALS — BP 98/66 | HR 70 | Temp 98.0°F | Resp 16 | Ht 67.52 in | Wt 176.8 lb

## 2017-11-29 DIAGNOSIS — J209 Acute bronchitis, unspecified: Secondary | ICD-10-CM | POA: Diagnosis not present

## 2017-11-29 DIAGNOSIS — R05 Cough: Secondary | ICD-10-CM | POA: Diagnosis not present

## 2017-11-29 DIAGNOSIS — R059 Cough, unspecified: Secondary | ICD-10-CM

## 2017-11-29 MED ORDER — HYDROCODONE-HOMATROPINE 5-1.5 MG/5ML PO SYRP: 2.5000 mL | ORAL_SOLUTION | Freq: Every day | ORAL | 0 refills | Status: AC

## 2017-11-29 MED ORDER — NAPROXEN 500 MG PO TABS: 500.0000 mg | ORAL_TABLET | Freq: Two times a day (BID) | ORAL | 0 refills | Status: DC

## 2017-11-29 NOTE — Progress Notes (Signed)
12/03/2017 1:00 PM   DOB: 11-Aug-1975 / MRN: [id number]  SUBJECTIVE:  Ray Wong is a well-appearing 42 y.o. male presenting for cough.  Reports some mild sore throat nasal congestion.  Associates fatigue.  Symptoms started 4 to 5 days ago.  Patient has been taking OTC meds with some relief.  He does not feel that he is getting better or worse.Denies chest pain, shortness of breath, DOE, leg swelling.   He is allergic to augmentin [amoxicillin-pot clavulanate].   He  has a past medical history of Allergy, Asthma, and Thalassemia.    He  reports that he has never smoked. He has never used smokeless tobacco. He reports that he drinks alcohol. He reports that he does not use drugs. He  has no sexual activity history on file. The patient  has no past surgical history on file.  His family history includes Heart disease in his father.  ROS per HPI  The problem list and medications were reviewed and updated by myself where necessary and exist elsewhere in the encounter.   OBJECTIVE:  BP 98/66   Pulse 70   Temp 98 F (36.7 C)   Resp 16   Ht 5' 7.52" (1.715 m)   Wt 176 lb 12.8 oz (80.2 kg)   SpO2 98%   BMI 27.27 kg/m   Physical Exam  Constitutional: He is oriented to person, place, and time. He appears well-developed. He does not appear ill.  HENT:  Right Ear: Hearing, tympanic membrane, external ear and ear canal normal.  Left Ear: Hearing, tympanic membrane, external ear and ear canal normal.  Nose: Nose normal. Right sinus exhibits no maxillary sinus tenderness and no frontal sinus tenderness. Left sinus exhibits no maxillary sinus tenderness and no frontal sinus tenderness.  Mouth/Throat: Uvula is midline, oropharynx is clear and moist and mucous membranes are normal. No oropharyngeal exudate, posterior oropharyngeal edema or tonsillar abscesses.  Eyes: Pupils are equal, round, and reactive to light. Conjunctivae and EOM are normal.  Cardiovascular: Normal rate, regular  rhythm, S1 normal, S2 normal, normal heart sounds, intact distal pulses and normal pulses. Exam reveals no gallop and no friction rub.  No murmur heard. Pulmonary/Chest: Effort normal. No stridor. No respiratory distress. He has no wheezes. He has no rales.  Abdominal: He exhibits no distension.  Musculoskeletal: Normal range of motion. He exhibits no edema.  Lymphadenopathy:       Head (right side): No submandibular and no tonsillar adenopathy present.       Head (left side): No submandibular and no tonsillar adenopathy present.    He has no cervical adenopathy.  Neurological: He is alert and oriented to person, place, and time. No cranial nerve deficit. Coordination normal.  Skin: Skin is warm and dry. He is not diaphoretic.  Psychiatric: He has a normal mood and affect.  Nursing note and vitals reviewed.   No results found for this or any previous visit (from the past 72 hour(s)).  No results found.  ASSESSMENT AND PLAN:  Sergey was seen today for uri.  Diagnoses and all orders for this visit:  Cough: We will treat symptomatically for now.  He continues to be symptomatic at day 10 of illness or if any point he seems to be getting worse I advised that he can call and I be happy to start him on doxycycline 100 mg twice daily for 10 days.  He will not need an office visit for this antibiotic. -  HYDROcodone-homatropine (HYCODAN) 5-1.5 MG/5ML syrup; Take 2.5-5 mLs by mouth at bedtime for 5 days.  Acute bronchitis, unspecified organism -     naproxen (NAPROSYN) 500 MG tablet; Take 1 tablet (500 mg total) by mouth 2 (two) times daily with a meal.    The patient is advised to call or return to clinic if he does not see an improvement in symptoms, or to seek the care of the closest emergency department if he worsens with the above plan.   Deliah BostonMichael Clark, MHS, PA-C Primary Care at [facility] 12/03/2017 1:00 PM

## 2017-11-29 NOTE — Patient Instructions (Addendum)
MyChart in a few days if you not better or sooner if getting worse and I can send an antibiotic.     IF you received an x-ray today, you will receive an invoice from [facility]. Please contact [facility] at [phone] with questions or concerns regarding your invoice.   IF you received labwork today, you will receive an invoice from [facility]. Please contact [facility] at [phone] with questions or concerns regarding your invoice.   Our billing staff will not be able to assist you with questions regarding bills from these companies.  You will be contacted with the lab results as soon as they are available. The fastest way to get your results is to activate your My Chart account. Instructions are located on the last page of this paperwork. If you have not heard from us regarding the results in 2 weeks, please contact this office.

## 2018-02-04 DIAGNOSIS — L259 Unspecified contact dermatitis, unspecified cause: Secondary | ICD-10-CM | POA: Diagnosis not present

## 2018-08-05 ENCOUNTER — Telehealth: Payer: Self-pay | Admitting: Internal Medicine

## 2018-08-05 ENCOUNTER — Inpatient Hospital Stay: Payer: 59 | Attending: Hematology and Oncology

## 2018-08-05 DIAGNOSIS — R7989 Other specified abnormal findings of blood chemistry: Secondary | ICD-10-CM | POA: Insufficient documentation

## 2018-08-05 DIAGNOSIS — D561 Beta thalassemia: Secondary | ICD-10-CM | POA: Insufficient documentation

## 2018-08-05 LAB — CBC WITH DIFFERENTIAL (CANCER CENTER ONLY)
Abs Immature Granulocytes: 0.01 10*3/uL (ref 0.00–0.07)
Basophils Absolute: 0.1 10*3/uL (ref 0.0–0.1)
Basophils Relative: 1 %
Eosinophils Absolute: 0.1 10*3/uL (ref 0.0–0.5)
Eosinophils Relative: 1 %
HCT: 44.3 % (ref 39.0–52.0)
Hemoglobin: 13.3 g/dL (ref 13.0–17.0)
Immature Granulocytes: 0 %
Lymphocytes Relative: 44 %
Lymphs Abs: 1.7 10*3/uL (ref 0.7–4.0)
MCH: 19 pg — ABNORMAL LOW (ref 26.0–34.0)
MCHC: 30 g/dL (ref 30.0–36.0)
MCV: 63.2 fL — ABNORMAL LOW (ref 80.0–100.0)
MONOS PCT: 8 %
Monocytes Absolute: 0.3 10*3/uL (ref 0.1–1.0)
Neutro Abs: 1.8 10*3/uL (ref 1.7–7.7)
Neutrophils Relative %: 46 %
Platelet Count: 209 10*3/uL (ref 150–400)
RBC: 7.01 MIL/uL — ABNORMAL HIGH (ref 4.22–5.81)
RDW: 17.2 % — AB (ref 11.5–15.5)
WBC Count: 3.9 10*3/uL — ABNORMAL LOW (ref 4.0–10.5)
nRBC: 0 % (ref 0.0–0.2)

## 2018-08-05 LAB — IRON AND TIBC
Iron: 170 ug/dL — ABNORMAL HIGH (ref 42–163)
SATURATION RATIOS: 56 % — AB (ref 20–55)
TIBC: 301 ug/dL (ref 202–409)
UIBC: 131 ug/dL (ref 117–376)

## 2018-08-05 LAB — CMP (CANCER CENTER ONLY)
ALBUMIN: 4.7 g/dL (ref 3.5–5.0)
ALK PHOS: 71 U/L (ref 38–126)
ALT: 18 U/L (ref 0–44)
ANION GAP: 9 (ref 5–15)
AST: 16 U/L (ref 15–41)
BUN: 15 mg/dL (ref 6–20)
CALCIUM: 9.7 mg/dL (ref 8.9–10.3)
CO2: 27 mmol/L (ref 22–32)
Chloride: 104 mmol/L (ref 98–111)
Creatinine: 1.17 mg/dL (ref 0.61–1.24)
GFR, Estimated: >60 mL/min (ref >60)
GLUCOSE: 93 mg/dL (ref 70–99)
POTASSIUM: 4.2 mmol/L (ref 3.5–5.1)
SODIUM: 140 mmol/L (ref 135–145)
TOTAL PROTEIN: 7.4 g/dL (ref 6.5–8.1)
Total Bilirubin: 1.8 mg/dL — ABNORMAL HIGH (ref 0.3–1.2)

## 2018-08-05 LAB — LACTATE DEHYDROGENASE: LDH: 134 U/L (ref 98–192)

## 2018-08-05 LAB — FERRITIN: Ferritin: 426 ng/mL — ABNORMAL HIGH (ref 24–336)

## 2018-08-05 NOTE — Telephone Encounter (Signed)
Former [facility] patient moved from 2/14 to 2/13 with Dr. Melton AlarHiggs. Left message. Schedule mailed.

## 2018-08-05 NOTE — Telephone Encounter (Signed)
Patient returned call and moved f/u from 2/13 to 3/5.

## 2018-08-11 ENCOUNTER — Ambulatory Visit: Payer: 59 | Admitting: Internal Medicine

## 2018-08-12 ENCOUNTER — Ambulatory Visit: Payer: 59 | Admitting: Hematology and Oncology

## 2018-09-01 ENCOUNTER — Other Ambulatory Visit: Payer: Self-pay

## 2018-09-01 ENCOUNTER — Telehealth: Payer: Self-pay | Admitting: Internal Medicine

## 2018-09-01 ENCOUNTER — Inpatient Hospital Stay: Payer: 59

## 2018-09-01 ENCOUNTER — Inpatient Hospital Stay: Payer: 59 | Attending: Hematology and Oncology | Admitting: Internal Medicine

## 2018-09-01 VITALS — BP 125/84 | HR 64 | Temp 98.5°F | Resp 17 | Ht 68.9 in | Wt 173.8 lb

## 2018-09-01 DIAGNOSIS — Z8249 Family history of ischemic heart disease and other diseases of the circulatory system: Secondary | ICD-10-CM | POA: Diagnosis not present

## 2018-09-01 DIAGNOSIS — D72819 Decreased white blood cell count, unspecified: Secondary | ICD-10-CM | POA: Diagnosis not present

## 2018-09-01 DIAGNOSIS — R899 Unspecified abnormal finding in specimens from other organs, systems and tissues: Secondary | ICD-10-CM

## 2018-09-01 DIAGNOSIS — D563 Thalassemia minor: Secondary | ICD-10-CM

## 2018-09-01 DIAGNOSIS — R7989 Other specified abnormal findings of blood chemistry: Secondary | ICD-10-CM | POA: Diagnosis not present

## 2018-09-01 DIAGNOSIS — D561 Beta thalassemia: Secondary | ICD-10-CM

## 2018-09-01 LAB — CBC WITH DIFFERENTIAL (CANCER CENTER ONLY)
Abs Immature Granulocytes: 0.01 10*3/uL (ref 0.00–0.07)
Basophils Absolute: 0 10*3/uL (ref 0.0–0.1)
Basophils Relative: 1 %
EOS PCT: 1 %
Eosinophils Absolute: 0.1 10*3/uL (ref 0.0–0.5)
HCT: 42.2 % (ref 39.0–52.0)
Hemoglobin: 12.9 g/dL — ABNORMAL LOW (ref 13.0–17.0)
Immature Granulocytes: 0 %
Lymphocytes Relative: 39 %
Lymphs Abs: 1.6 10*3/uL (ref 0.7–4.0)
MCH: 19.3 pg — ABNORMAL LOW (ref 26.0–34.0)
MCHC: 30.6 g/dL (ref 30.0–36.0)
MCV: 63 fL — ABNORMAL LOW (ref 80.0–100.0)
Monocytes Absolute: 0.3 10*3/uL (ref 0.1–1.0)
Monocytes Relative: 7 %
Neutro Abs: 2.1 10*3/uL (ref 1.7–7.7)
Neutrophils Relative %: 52 %
Platelet Count: 215 10*3/uL (ref 150–400)
RBC: 6.7 MIL/uL — ABNORMAL HIGH (ref 4.22–5.81)
RDW: 16.9 % — ABNORMAL HIGH (ref 11.5–15.5)
WBC Count: 4.1 10*3/uL (ref 4.0–10.5)
nRBC: 0 % (ref 0.0–0.2)

## 2018-09-01 NOTE — Progress Notes (Signed)
Diagnosis Beta thalassemia trait - Plan: Hemochromatosis DNA, PCR, CBC with Differential (Cancer Center Only), Transferrin Saturation, CANCELED: CMP (Cancer Center only), CANCELED: Lactate dehydrogenase (LDH), CANCELED: Ferritin, CANCELED: Iron and TIBC  Abnormal laboratory test - Plan: Hemochromatosis DNA, PCR, CBC with Differential (Cancer Center Only), Transferrin Saturation, CANCELED: CMP (Cancer Center only), CANCELED: Lactate dehydrogenase (LDH), CANCELED: Ferritin, CANCELED: Iron and TIBC  Staging Cancer Staging No matching staging information was found for the patient.  Assessment and Plan:  1.  Beta thalassemia minor.   Pt had hemoglobin electrophoresis done 05/13/2017 which is consistent with Beta thalassemia minor.  Pt reports his mother also has the condition.    Discussion with pt today regarding diagnosis.  In Beta thalassemia minor usually red cells appear small and pt has MCV of 63.  People with beta thalassemia minor have both normal hemoglobin A and the abnormal beta thalassemia (?) hemoglobin in their red blood cells.  People with beta thalassemia minor do not have beta thalassemia disease or sickle cell disease. They cannot develop these diseases later in life. They can pass beta thalassemia trait to their children.  Individuals with thalassemia minor, thalassemia trait, typically do not require any interventions for anemia or other deviations from routine medical care. However, it is important for these individuals to be aware of their diagnosis so that they do not undergo unnecessary testing or empiric treatment with iron for an incorrect diagnosis of iron deficiency, as well as for prenatal planning.    I have provided him Fact sheet from [facility] that describes the condition and presents scenerios for prenatal counseling.  Pt reports he already has children.    Pt will RTC in 1 year with repeat labs.   2. Elevated Ferritin.  Pt has mildly elevated ferritin of 426.    Transferrin saturation elevated at 56.  Pt denies family history of liver disorders. LFTs WNL Will check hemochromatosis gene evaluation and notify pt of result.  No intervention necessary currently pending hemochromatosis results.    3.  Leukopenia.  Labs done 08/05/2018 reviewed and showed WBC 4 HB 13.3 plts 209,000.  He has a normal differential.  Chemistries WNL with K+ 4.2 Cr 1.17 and normal LFTs.  LDH normal at 134.  Labs done today 09/01/2018 reviewed and shows WBC 4.1 HB 12.9 Plts 215.  Counts WNL.    Pt denies any recent infections.  Suspect normal variant.  Will repeat labs in 1 year on RTC.    4.  Family history of Beta thalassemia minor.  Pt reports his mother has diagnosis.    25 minutes spent with more than 50% spent in review of records, counseling and coordination of care.     Interval History:  Historical data obtained from note dated 08/13/2017.  43 y.o. male previously followed by Dr. Gweneth Dimitri Pt denied symptoms with reported history of thalassemia and leukopenia with predominance of neutropenia of unknown duration on the most recent lab work.  Current Status:  Pt is seen today for follow-up.  He is here to go over blood work.  He reports mother has thalassemia trait.  Denies any family history of liver disorders.    Problem List Patient Active Problem List   Diagnosis Date Noted  . Elevated ferritin [R79.89] 09/05/2017  . Sinus congestion [R09.81] 05/27/2017  . History of asthma [Z87.09] 05/27/2017  . Thalassemia [D56.9] 05/27/2017  . History of cervical fracture [Z87.81] 03/15/2017  . Neutropenia (HCC) [D70.9] 03/15/2017  . Routine general medical examination at a  health care facility [Z00.00] 03/03/2017  . Exercise-induced asthma [J45.990] 10/01/2016    Past Medical History Past Medical History:  Diagnosis Date  . Allergy   . Asthma   . Thalassemia     Past Surgical History No past surgical history on file.  Family History Family History  Problem Relation Age  of Onset  . Heart disease Father      Social History  reports that he has never smoked. He has never used smokeless tobacco. He reports current alcohol use. He reports that he does not use drugs.  Medications  Current Outpatient Medications:  .  fluticasone (FLONASE) 50 MCG/ACT nasal spray, Place into both nostrils daily., Disp: , Rfl:   Allergies Augmentin [amoxicillin-pot clavulanate]  Review of Systems Review of Systems - Oncology ROS negative   Physical Exam  Vitals Wt Readings from Last 3 Encounters:  09/01/18 173 lb 12.8 oz (78.8 kg)  11/29/17 176 lb 12.8 oz (80.2 kg)  08/13/17 174 lb 11.2 oz (79.2 kg)   Temp Readings from Last 3 Encounters:  09/01/18 98.5 F (36.9 C) (Oral)  11/29/17 98 F (36.7 C)  08/13/17 99 F (37.2 C) (Oral)   BP Readings from Last 3 Encounters:  09/01/18 125/84  11/29/17 98/66  08/13/17 125/84   Pulse Readings from Last 3 Encounters:  09/01/18 64  11/29/17 70  08/13/17 (!) 58   Constitutional: Well-developed, well-nourished, and in no distress.   HENT: Head: Normocephalic and atraumatic.  Mouth/Throat: No oropharyngeal exudate. Mucosa moist. Eyes: Pupils are equal, round, and reactive to light. Conjunctivae are normal. No scleral icterus.  Neck: Normal range of motion. Neck supple. No JVD present.  Cardiovascular: Normal rate, regular rhythm and normal heart sounds.  Exam reveals no gallop and no friction rub.   No murmur heard. Pulmonary/Chest: Effort normal and breath sounds normal. No respiratory distress. No wheezes.No rales.  Abdominal: Soft. Bowel sounds are normal. No distension. There is no tenderness. There is no guarding.  Musculoskeletal: No edema or tenderness.  Lymphadenopathy: No cervical, axillary or supraclavicular adenopathy.  Neurological: Alert and oriented to person, place, and time. No cranial nerve deficit.  Skin: Skin is warm and dry. No rash noted. No erythema. No pallor.  Psychiatric: Affect and judgment  normal.   Labs Appointment on 09/01/2018  Component Date Value Ref Range Status  . WBC Count 09/01/2018 4.1  4.0 - 10.5 K/uL Final  . RBC 09/01/2018 6.70* 4.22 - 5.81 MIL/uL Final  . Hemoglobin 09/01/2018 12.9* 13.0 - 17.0 g/dL Final  . HCT 92/95/7473 42.2  39.0 - 52.0 % Final  . MCV 09/01/2018 63.0* 80.0 - 100.0 fL Final  . MCH 09/01/2018 19.3* 26.0 - 34.0 pg Final  . MCHC 09/01/2018 30.6  30.0 - 36.0 g/dL Final  . RDW 40/37/0964 16.9* 11.5 - 15.5 % Final  . Platelet Count 09/01/2018 215  150 - 400 K/uL Final  . nRBC 09/01/2018 0.0  0.0 - 0.2 % Final  . Neutrophils Relative % 09/01/2018 52  % Final  . Neutro Abs 09/01/2018 2.1  1.7 - 7.7 K/uL Final  . Lymphocytes Relative 09/01/2018 39  % Final  . Lymphs Abs 09/01/2018 1.6  0.7 - 4.0 K/uL Final  . Monocytes Relative 09/01/2018 7  % Final  . Monocytes Absolute 09/01/2018 0.3  0.1 - 1.0 K/uL Final  . Eosinophils Relative 09/01/2018 1  % Final  . Eosinophils Absolute 09/01/2018 0.1  0.0 - 0.5 K/uL Final  . Basophils Relative 09/01/2018 1  %  Final  . Basophils Absolute 09/01/2018 0.0  0.0 - 0.1 K/uL Final  . Immature Granulocytes 09/01/2018 0  % Final  . Abs Immature Granulocytes 09/01/2018 0.01  0.00 - 0.07 K/uL Final   Performed at [facility], [address], [place], [place] [address]     Pathology Orders Placed This Encounter  Procedures  . Hemochromatosis DNA, PCR    Standing Status:   Future    Number of Occurrences:   1    Standing Expiration Date:   09/01/2019  . CBC with Differential (Cancer Center Only)    Standing Status:   Future    Number of Occurrences:   1    Standing Expiration Date:   08/31/2020  . Transferrin Saturation    Standing Status:   Future    Number of Occurrences:   1    Standing Expiration Date:   08/31/2020       Ahmed Prima MD

## 2018-09-01 NOTE — Telephone Encounter (Signed)
Gave avs. Will schedule once template/schedule is available.

## 2018-09-08 LAB — HEMOCHROMATOSIS DNA-PCR(C282Y,H63D)

## 2018-09-09 ENCOUNTER — Telehealth: Payer: Self-pay | Admitting: *Deleted

## 2018-09-09 NOTE — Telephone Encounter (Signed)
-----   Message from Ahmed Prima, MD sent at 09/09/2018 12:53 PM EDT ----- Can you notify pt his test for hemochromatosis was negative.  He will have yearly follow-up

## 2018-09-09 NOTE — Telephone Encounter (Signed)
TCT patient regarding lab results. Spoke with patient and informed him that the test for hemochromotosis was negative and that Dr. Melton Alar recommended yearly f/u. Patient voiced understanding. No questions or concerns at this time.

## 2019-01-04 ENCOUNTER — Other Ambulatory Visit: Payer: Self-pay

## 2019-01-04 ENCOUNTER — Telehealth: Payer: Self-pay | Admitting: Family Medicine

## 2019-01-04 ENCOUNTER — Ambulatory Visit: Payer: 59 | Admitting: Family Medicine

## 2019-01-04 ENCOUNTER — Encounter: Payer: Self-pay | Admitting: Family Medicine

## 2019-01-04 VITALS — BP 111/76 | HR 70 | Temp 99.0°F | Resp 16 | Ht 68.0 in | Wt 168.0 lb

## 2019-01-04 DIAGNOSIS — K625 Hemorrhage of anus and rectum: Secondary | ICD-10-CM | POA: Diagnosis not present

## 2019-01-04 DIAGNOSIS — R0602 Shortness of breath: Secondary | ICD-10-CM | POA: Insufficient documentation

## 2019-01-04 DIAGNOSIS — R1013 Epigastric pain: Secondary | ICD-10-CM | POA: Insufficient documentation

## 2019-01-04 DIAGNOSIS — R110 Nausea: Secondary | ICD-10-CM | POA: Diagnosis not present

## 2019-01-04 LAB — HEMOCCULT GUIAC POC 1CARD (OFFICE): Fecal Occult Blood, POC: NEGATIVE

## 2019-01-04 MED ORDER — OMEPRAZOLE 40 MG PO CPDR: 40.0000 mg | DELAYED_RELEASE_CAPSULE | Freq: Every day | ORAL | 3 refills | Status: DC

## 2019-01-04 NOTE — Patient Instructions (Addendum)
   Please call and Schedule [place] after you receive your results from your COVID testing. The number to call and schedule appointment: [phone] your MRN number is [id number]   If you have lab work done today you will be contacted with your lab results within the next 2 weeks.  If you have not heard from [place] then please contact us. The fastest way to get your results is to register for My Chart.   IF you received an x-ray today, you will receive an invoice from [facility]. Please contact [facility] at [phone] with questions or concerns regarding your invoice.   IF you received labwork today, you will receive an invoice from [facility]. Please contact [facility] at [phone] with questions or concerns regarding your invoice.   Our billing staff will not be able to assist you with questions regarding bills from these companies.  You will be contacted with the lab results as soon as they are available. The fastest way to get your results is to activate your My Chart account. Instructions are located on the last page of this paperwork. If you have not heard from [place] regarding the results in 2 weeks, please contact this office.

## 2019-01-04 NOTE — Telephone Encounter (Signed)
SOB, nausea-please test asap as tests and referral on hold until pt has COVID results

## 2019-01-04 NOTE — Progress Notes (Signed)
Acute Office Visit  Subjective:    Patient ID: Ray Wong, male    DOB: Aug 29, 1975, 43 y.o.   MRN: [id number]  Chief Complaint  Patient presents with  . Nausea    x 1 month with some bloting and gas in the chest ( Burping alot)   . Palpitations    some shob when he gets active     HPI Patient is in today for nausea noted and BRBPR-no pain No hemorrhoids Urine normal No fever Cold sweats-"clammy" Nausea causing anorexia No vomiting Bloating noted one day Types of food does not change symptoms   Pt states mentally "foggy" Monday-no dizziness Fatigue noted SOB occasionally-walking through the kitchen with/in the last few days pt with sob Exercise induced asthma-occasionally need inhaler-run/walk made symptoms worse Pt states if he goes for a run similar symptoms No cough  Allergies-flonase nasal spray-fall worse  Travel to 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 Allergy   . Asthma   . Thalassemia     History reviewed. No pertinent surgical history.  Family History  Problem Relation Age of Onset  . Heart disease Father     Social History   Socioeconomic History  . Marital status: Single    Spouse name: Not on file  . Number of children: Not on file  . Years of education: Not on file  . Highest education level: Not on file  Occupational History  . Not on file  Social Needs  . Financial resource strain: Not on file  . Food insecurity    Worry: Not on file    Inability: Not on file  . Transportation needs    Medical: Not on file    Non-medical: Not on file  Tobacco Use  . Smoking status: Never Smoker  . Smokeless tobacco: Never Used  Substance and Sexual Activity  . Alcohol use: Yes    Alcohol/week: 0.0 standard drinks  . Drug use: No  . Sexual activity: Not on file  Lifestyle  . Physical activity    Days per week: Not on file    Minutes per session: Not on file   . Stress: Not on file  Relationships  . Social Herbalist on phone: Not on file    Gets together: Not on file    Attends religious service: Not on file    Active member of club or organization: Not on file    Attends meetings of clubs or organizations: Not on file    Relationship status: Not on file  . Intimate partner violence    Fear of current or ex partner: Not on file    Emotionally abused: Not on file    Physically abused: Not on file    Forced sexual activity: Not on file  Other Topics Concern  . Not on file  Social History Narrative  . Not on file    Outpatient Medications Prior to Visit  Medication Sig Dispense Refill  . fluticasone (FLONASE) 50 MCG/ACT nasal spray Place into both nostrils daily.     No facility-administered medications prior to visit.     Allergies  Allergen Reactions  . Augmentin [Amoxicillin-Pot Clavulanate] Rash    Diffuse maculopapular rash.    Review of Systems  Constitutional: Negative for chills and fever.  HENT: Positive for congestion. Negative for sore throat and tinnitus.   Respiratory: Positive for shortness of breath. Negative  for cough.   Cardiovascular: Positive for palpitations.  Gastrointestinal: Positive for abdominal pain, blood in stool and nausea. Negative for vomiting.  Genitourinary: Negative for dysuria and urgency.  Neurological: Negative for dizziness and headaches.  bloating and pressure-right sided sharper pain noted for day Palpitations -no increase rate, no tightness, associated SOB-noted for a few minutes    Objective:    Physical Exam  Constitutional: He is oriented to person, place, and time. He appears well-developed and well-nourished. No distress.  HENT:  Head: Normocephalic and atraumatic.  Right Ear: External ear normal.  Left Ear: External ear normal.  Nose: Nose normal.  Mouth/Throat: Oropharynx is clear and moist. No oropharyngeal exudate.  Eyes: Conjunctivae are normal.   Cardiovascular: Normal rate, regular rhythm, normal heart sounds and intact distal pulses.  Pulmonary/Chest: Effort normal and breath sounds normal.  Abdominal: Soft. Bowel sounds are normal.  Genitourinary:    Rectum normal.   Lymphadenopathy:    He has no cervical adenopathy.  Neurological: He is alert and oriented to person, place, and time.    BP 111/76   Pulse 70   Temp 99 F (37.2 C) (Oral)   Resp 16   Ht _0  (1.727 m)   Wt 168 lb (76.2 kg)   SpO2 100%   BMI 25.54 kg/m  Wt Readings from Last 3 Encounters:  01/04/19 168 lb (76.2 kg)  09/01/18 173 lb 12.8 oz (78.8 kg)  11/29/17 176 lb 12.8 oz (80.2 kg)    Health Maintenance Due  Topic Date Due  . TETANUS/TDAP  07/05/1993    Lab Results  Component Value Date   TSH 1.540 03/03/2017   Lab Results  Component Value Date   WBC 4.1 09/01/2018   HGB 12.9 (L) 09/01/2018   HCT 42.2 09/01/2018   MCV 63.0 (L) 09/01/2018   PLT 215 09/01/2018   Lab Results  Component Value Date   NA 140 08/05/2018   K 4.2 08/05/2018   CHLORIDE 104 05/06/2017   CO2 27 08/05/2018   GLUCOSE 93 08/05/2018   BUN 15 08/05/2018   CREATININE 1.17 08/05/2018   BILITOT 1.8 (H) 08/05/2018   ALKPHOS 71 08/05/2018   AST 16 08/05/2018   ALT 18 08/05/2018   PROT 7.4 08/05/2018   ALBUMIN 4.7 08/05/2018   CALCIUM 9.7 08/05/2018   ANIONGAP 9 08/05/2018   EGFR >60 05/06/2017   Lab Results  Component Value Date   CHOL 129 03/03/2017   Lab Results  Component Value Date   HDL 45 03/03/2017   Lab Results  Component Value Date   LDLCALC 66 03/03/2017   Lab Results  Component Value Date   TRIG 92 03/03/2017   Lab Results  Component Value Date   CHOLHDL 2.9 03/03/2017   Lab Results  Component Value Date   HGBA1C 4.9 03/03/2017       Assessment & Plan:   1. Abdominal pain, epigastric - CBC with Differential - CMP14+EGFR - Amylase - Lipase - US Abdomen Limited; Future-HOLD for COVID testing  2. BRBPR (bright red blood per  rectum) GI referral - US Abdomen Limited; Future  3. Nausea without vomiting GI referral - US Abdomen Limited; Future  4. Shortness of breath COVID testing - US Abdomen Limited; Future  LISA Hannah Beat, MD

## 2019-01-04 NOTE — Addendum Note (Signed)
Addended by: Ileana Roup on: 01/04/2019 02:32 PM   Modules accepted: Orders

## 2019-01-05 ENCOUNTER — Telehealth: Payer: Self-pay | Admitting: Emergency Medicine

## 2019-01-05 DIAGNOSIS — Z20822 Contact with and (suspected) exposure to covid-19: Secondary | ICD-10-CM

## 2019-01-05 LAB — CMP14+EGFR
ALT: 17 IU/L (ref 0–44)
AST: 20 IU/L (ref 0–40)
Albumin/Globulin Ratio: 2.8 — ABNORMAL HIGH (ref 1.2–2.2)
Albumin: 4.7 g/dL (ref 4.0–5.0)
Alkaline Phosphatase: 64 IU/L (ref 39–117)
BUN/Creatinine Ratio: 13 (ref 9–20)
BUN: 16 mg/dL (ref 6–24)
Bilirubin Total: 1.1 mg/dL (ref 0.0–1.2)
CO2: 22 mmol/L (ref 20–29)
Calcium: 9.7 mg/dL (ref 8.7–10.2)
Chloride: 102 mmol/L (ref 96–106)
Creatinine, Ser: 1.21 mg/dL (ref 0.76–1.27)
GFR calc Af Amer: 84 mL/min/{1.73_m2} (ref >59)
GFR calc non Af Amer: 72 mL/min/{1.73_m2} (ref >59)
Globulin, Total: 1.7 g/dL (ref 1.5–4.5)
Glucose: 88 mg/dL (ref 65–99)
Potassium: 4.4 mmol/L (ref 3.5–5.2)
Sodium: 138 mmol/L (ref 134–144)
Total Protein: 6.4 g/dL (ref 6.0–8.5)

## 2019-01-05 LAB — CBC WITH DIFFERENTIAL/PLATELET
Basophils Absolute: 0.1 10*3/uL (ref 0.0–0.2)
Basos: 2 %
EOS (ABSOLUTE): 0 10*3/uL (ref 0.0–0.4)
Eos: 1 %
Hematocrit: 42 % (ref 37.5–51.0)
Hemoglobin: 12.5 g/dL — ABNORMAL LOW (ref 13.0–17.7)
Immature Grans (Abs): 0 10*3/uL (ref 0.0–0.1)
Immature Granulocytes: 0 %
Lymphocytes Absolute: 1.7 10*3/uL (ref 0.7–3.1)
Lymphs: 35 %
MCH: 19.4 pg — ABNORMAL LOW (ref 26.6–33.0)
MCHC: 29.8 g/dL — ABNORMAL LOW (ref 31.5–35.7)
MCV: 65 fL — ABNORMAL LOW (ref 79–97)
Monocytes Absolute: 0.3 10*3/uL (ref 0.1–0.9)
Monocytes: 7 %
Neutrophils Absolute: 2.7 10*3/uL (ref 1.4–7.0)
Neutrophils: 55 %
Platelets: 227 10*3/uL (ref 150–450)
RBC: 6.43 x10E6/uL — ABNORMAL HIGH (ref 4.14–5.80)
RDW: 18.5 % — ABNORMAL HIGH (ref 11.6–15.4)
WBC: 4.8 10*3/uL (ref 3.4–10.8)

## 2019-01-05 LAB — LIPASE: Lipase: 19 U/L (ref 13–78)

## 2019-01-05 LAB — AMYLASE: Amylase: 66 U/L (ref 31–110)

## 2019-01-05 NOTE — Telephone Encounter (Signed)
Please see telephone counter placed today. This has been taken care of

## 2019-01-05 NOTE — Telephone Encounter (Signed)
Called pt and scheduled him for testing for tomorrow 01/06/19 at the [facility] testing site for 8:45. Pt is aware to stay in this vehicle and must be wearing a mask. Pt is aware that results could be back in a time frame of 3-7 days and that since his mychart is active, he can also see results there. Pt understood and had no additional questions at this time. Nothing further is needed      Maryruth Hancock, MD to [employer]      01/04/19 1:21 PM Pt with SOB And nausea-holding additional tests needed for symptoms and referral until results due to symptoms

## 2019-01-05 NOTE — Telephone Encounter (Signed)
General/Other - Patient is calling because he was seen by his PCP yesterday and was told he needed a COVID-19 test. He was expecting to get a call back.

## 2019-01-06 ENCOUNTER — Telehealth: Payer: Self-pay | Admitting: Emergency Medicine

## 2019-01-06 ENCOUNTER — Other Ambulatory Visit: Payer: 59

## 2019-01-06 DIAGNOSIS — Z20822 Contact with and (suspected) exposure to covid-19: Secondary | ICD-10-CM

## 2019-01-06 NOTE — Telephone Encounter (Signed)
Copied from [place] [id number]. Topic: Quick Communication - Lab Results (Clinic Use ONLY) >> Jan 06, 2019 12:39 PM Scherrie Gerlach wrote: Pt states he missed a call from the office.  Not sure if someone wanted to go over his labs, but there is not a message.

## 2019-01-06 NOTE — Progress Notes (Signed)
Lm for pt to call bk for lab results 

## 2019-01-09 ENCOUNTER — Ambulatory Visit (HOSPITAL_COMMUNITY): Payer: 59

## 2019-01-12 LAB — NOVEL CORONAVIRUS, NAA: SARS-CoV-2, NAA: NOT DETECTED

## 2019-01-13 NOTE — Progress Notes (Signed)
Pt is aware of results, no follow requested at this time

## 2019-01-16 ENCOUNTER — Telehealth (INDEPENDENT_AMBULATORY_CARE_PROVIDER_SITE_OTHER): Payer: 59 | Admitting: Emergency Medicine

## 2019-01-16 ENCOUNTER — Encounter: Payer: Self-pay | Admitting: Emergency Medicine

## 2019-01-16 VITALS — Wt 168.0 lb

## 2019-01-16 DIAGNOSIS — R1084 Generalized abdominal pain: Secondary | ICD-10-CM

## 2019-01-16 DIAGNOSIS — R1083 Colic: Secondary | ICD-10-CM

## 2019-01-16 MED ORDER — DICYCLOMINE HCL 20 MG PO TABS: 20.0000 mg | ORAL_TABLET | Freq: Four times a day (QID) | ORAL | 1 refills | Status: DC | PRN

## 2019-01-16 NOTE — Progress Notes (Signed)
Telemedicine Encounter- SOAP NOTE Established Patient  This video-telephone DoxyMe  encounter was conducted with the patient's (or proxy's) verbal consent via videoaudio telecommunications: yes/no: Yes Patient was instructed to have this encounter in a suitably private space; and to only have persons present to whom they give permission to participate. In addition, patient identity was confirmed by use of name plus two identifiers (DOB and address).  I discussed the limitations, risks, security and privacy concerns of performing an evaluation and management service by telephone and the availability of in person appointments. I also discussed with the patient that there may be a patient responsible charge related to this service. The patient expressed understanding and agreed to proceed.  I spent a total of TIME; 0 MIN TO 60 MIN: 15 minutes talking with the patient or their proxy.  No chief complaint on file. Abdominal pain  Subjective   Ray Wong is a 43 y.o. male established patient. Telephone visit today for evaluation of abdominal pain that started on and off about 1 month ago.  Seen here on 01/04/2019 by Dr. Holly Bodily.  Blood work within normal limits.  Coronavirus test negative.  Scheduled for abdominal ultrasound next Wednesday.  Also scheduled for GI evaluation next month.  This past weekend developed fever and diarrhea.  Also complaining of intermittent short-lived sharp stabbing mid abdominal pain episodes.  Denies vomiting or rectal bleeding.  Able to drink liquids.  No significant lifestyle changes.  Stress level about the same as usual.  Has not been taking omeprazole and it really did not make a difference.  Denies any other significant symptoms.  HPI   Patient Active Problem List   Diagnosis Date Noted  . Elevated ferritin 09/05/2017  . Thalassemia 05/27/2017  . History of cervical fracture 03/15/2017  . Neutropenia ([place]) 03/15/2017  . Exercise-induced asthma 10/01/2016     Past Medical History:  Diagnosis Date  . Allergy   . Asthma   . Thalassemia     Current Outpatient Medications  Medication Sig Dispense Refill  . omeprazole (PRILOSEC) 40 MG capsule Take 1 capsule (40 mg total) by mouth daily. 30 capsule 3  . dicyclomine (BENTYL) 20 MG tablet Take 1 tablet (20 mg total) by mouth every 6 (six) hours as needed for spasms. 20 tablet 1   No current facility-administered medications for this visit.     Allergies  Allergen Reactions  . Augmentin [Amoxicillin-Pot Clavulanate] Rash    Diffuse maculopapular rash.    Social History   Socioeconomic History  . Marital status: Single    Spouse name: Not on file  . Number of children: Not on file  . Years of education: Not on file  . Highest education level: Not on file  Occupational History  . Not on file  Social Needs  . Financial resource strain: Not on file  . Food insecurity    Worry: Not on file    Inability: Not on file  . Transportation needs    Medical: Not on file    Non-medical: Not on file  Tobacco Use  . Smoking status: Never Smoker  . Smokeless tobacco: Never Used  Substance and Sexual Activity  . Alcohol use: Yes    Alcohol/week: 0.0 standard drinks  . Drug use: No  . Sexual activity: Not on file  Lifestyle  . Physical activity    Days per week: Not on file    Minutes per session: Not on file  . Stress: Not on file  Relationships  . Social Musicianconnections    Talks on phone: Not on file    Gets together: Not on file    Attends religious service: Not on file    Active member of club or organization: Not on file    Attends meetings of clubs or organizations: Not on file    Relationship status: Not on file  . Intimate partner violence    Fear of current or ex partner: Not on file    Emotionally abused: Not on file    Physically abused: Not on file    Forced sexual activity: Not on file  Other Topics Concern  . Not on file  Social History Narrative  . Not on file     Review of Systems  Constitutional: Positive for fever. Negative for malaise/fatigue.  HENT: Negative.  Negative for congestion and sore throat.   Eyes: Negative.   Respiratory: Negative.  Negative for cough, shortness of breath and wheezing.   Cardiovascular: Negative.  Negative for chest pain and palpitations.  Gastrointestinal: Positive for abdominal pain, diarrhea and nausea. Negative for blood in stool, melena and vomiting.  Genitourinary: Negative.  Negative for dysuria and hematuria.  Musculoskeletal: Negative.  Negative for myalgias.  Skin: Negative.  Negative for rash.  Neurological: Negative for dizziness and headaches.  All other systems reviewed and are negative.   Objective   Vitals as reported by the patient: Today's Vitals   01/16/19 0958  Weight: 168 lb (76.2 kg)  Physical Exam Constitutional:      Appearance: Normal appearance.  HENT:     Head: Normocephalic.  Eyes:     Extraocular Movements: Extraocular movements intact.  Neck:     Musculoskeletal: Normal range of motion.  Pulmonary:     Effort: Pulmonary effort is normal.  Musculoskeletal: Normal range of motion.  Neurological:     Mental Status: He is alert and oriented to person, place, and time.  Psychiatric:        Mood and Affect: Mood normal.        Behavior: Behavior normal.     There are no diagnoses linked to this encounter.  Diagnoses and all orders for this visit:  Generalized abdominal pain Comments: Suspected IBS  Abdominal colic -     dicyclomine (BENTYL) 20 MG tablet; Take 1 tablet (20 mg total) by mouth every 6 (six) hours as needed for spasms.   Patient was scheduled for abdominal ultrasound next Wednesday, in 2 days. Also has GI evaluation scheduled for next month. Advised about diet and fluids. Contact the office if general condition deteriorates or clinical picture changes.  I discussed the assessment and treatment plan with the patient. The patient was provided an  opportunity to ask questions and all were answered. The patient agreed with the plan and demonstrated an understanding of the instructions.   The patient was advised to call back or seek an in-person evaluation if the symptoms worsen or if the condition fails to improve as anticipated.  I provided 15 minutes of non-face-to-face time during this encounter.  Georgina QuintMiguel Jose Talene Glastetter, MD  [facility]

## 2019-01-16 NOTE — Progress Notes (Signed)
Patient states 1-2 weeks ago he was seen for abdominal pain.  He states that his symptoms have gotten worse.   He states he is having sharp pain in his belly around his naval and the pain does not radiate. He does mention that he is having shortness of breath  with activity but it resides with rest.  He denies having any episodes of nausea or vomiting but does state he has diarrhea (no bleeding in stool).    He mentions he had a fever Saturday and Sunday and he took Tylenol to help reduce fever and pain.   Nothing makes his abdominal pain better or worse.  He is currently only tolerating eating crackers with various types of fluids such as Gatorade, ginger ale and water.

## 2019-01-18 ENCOUNTER — Other Ambulatory Visit: Payer: Self-pay

## 2019-01-18 ENCOUNTER — Ambulatory Visit (HOSPITAL_COMMUNITY)
Admission: RE | Admit: 2019-01-18 | Discharge: 2019-01-18 | Disposition: A | Discharge status: Home / Self Care | Payer: 59 | Source: Ambulatory Visit | Attending: Family Medicine | Admitting: Family Medicine

## 2019-01-18 DIAGNOSIS — R110 Nausea: Secondary | ICD-10-CM | POA: Insufficient documentation

## 2019-01-18 DIAGNOSIS — R1013 Epigastric pain: Secondary | ICD-10-CM | POA: Diagnosis present

## 2019-01-18 DIAGNOSIS — K625 Hemorrhage of anus and rectum: Secondary | ICD-10-CM | POA: Diagnosis present

## 2019-01-18 DIAGNOSIS — R0602 Shortness of breath: Secondary | ICD-10-CM | POA: Diagnosis present

## 2019-01-30 ENCOUNTER — Telehealth: Payer: Self-pay

## 2019-01-30 NOTE — Telephone Encounter (Signed)
Rescheduled appt with Tye Savoy later in August due to the fact that Dr. Ardis Hughs will be out of the office on 8-4 when he was originally scheduled

## 2019-01-31 ENCOUNTER — Ambulatory Visit: Payer: 59 | Admitting: Gastroenterology

## 2019-02-22 ENCOUNTER — Ambulatory Visit: Payer: 59 | Admitting: Nurse Practitioner

## 2019-04-12 ENCOUNTER — Other Ambulatory Visit: Payer: Self-pay

## 2019-04-12 ENCOUNTER — Telehealth: Payer: Self-pay | Admitting: Emergency Medicine

## 2019-04-12 DIAGNOSIS — Z131 Encounter for screening for diabetes mellitus: Secondary | ICD-10-CM

## 2019-04-12 DIAGNOSIS — Z130 Encounter for screening for diseases of the blood and blood-forming organs and certain disorders involving the immune mechanism: Secondary | ICD-10-CM

## 2019-04-12 DIAGNOSIS — Z1329 Encounter for screening for other suspected endocrine disorder: Secondary | ICD-10-CM

## 2019-04-12 DIAGNOSIS — Z0000 Encounter for general adult medical examination without abnormal findings: Secondary | ICD-10-CM

## 2019-04-12 DIAGNOSIS — Z13220 Encounter for screening for lipoid disorders: Secondary | ICD-10-CM

## 2019-04-12 NOTE — Telephone Encounter (Signed)
Pt has physical 10.22.2020 has nurse schedule for Monday the 19th for labs, please put put labs in   FR

## 2019-04-12 NOTE — Telephone Encounter (Signed)
Labs has been placed.

## 2019-04-18 ENCOUNTER — Other Ambulatory Visit: Payer: Self-pay

## 2019-04-18 ENCOUNTER — Ambulatory Visit: Payer: Managed Care, Other (non HMO)

## 2019-04-18 DIAGNOSIS — Z1329 Encounter for screening for other suspected endocrine disorder: Secondary | ICD-10-CM

## 2019-04-18 DIAGNOSIS — Z13220 Encounter for screening for lipoid disorders: Secondary | ICD-10-CM

## 2019-04-18 DIAGNOSIS — Z131 Encounter for screening for diabetes mellitus: Secondary | ICD-10-CM

## 2019-04-18 DIAGNOSIS — Z130 Encounter for screening for diseases of the blood and blood-forming organs and certain disorders involving the immune mechanism: Secondary | ICD-10-CM

## 2019-04-18 DIAGNOSIS — Z0000 Encounter for general adult medical examination without abnormal findings: Secondary | ICD-10-CM

## 2019-04-19 ENCOUNTER — Telehealth: Payer: Self-pay

## 2019-04-19 LAB — COMPREHENSIVE METABOLIC PANEL
ALT: 24 IU/L (ref 0–44)
AST: 23 IU/L (ref 0–40)
Albumin/Globulin Ratio: 2.4 — ABNORMAL HIGH (ref 1.2–2.2)
Albumin: 4.8 g/dL (ref 4.0–5.0)
Alkaline Phosphatase: 68 IU/L (ref 39–117)
BUN/Creatinine Ratio: 13 (ref 9–20)
BUN: 14 mg/dL (ref 6–24)
Bilirubin Total: 1.1 mg/dL (ref 0.0–1.2)
CO2: 25 mmol/L (ref 20–29)
Calcium: 9.6 mg/dL (ref 8.7–10.2)
Chloride: 102 mmol/L (ref 96–106)
Creatinine, Ser: 1.06 mg/dL (ref 0.76–1.27)
GFR calc Af Amer: 98 mL/min/{1.73_m2} (ref >59)
GFR calc non Af Amer: 85 mL/min/{1.73_m2} (ref >59)
Globulin, Total: 2 g/dL (ref 1.5–4.5)
Glucose: 93 mg/dL (ref 65–99)
Potassium: 4.8 mmol/L (ref 3.5–5.2)
Sodium: 139 mmol/L (ref 134–144)
Total Protein: 6.8 g/dL (ref 6.0–8.5)

## 2019-04-19 LAB — CBC WITH DIFFERENTIAL/PLATELET
Basophils Absolute: 0.1 10*3/uL (ref 0.0–0.2)
Basos: 1 %
EOS (ABSOLUTE): 0.1 10*3/uL (ref 0.0–0.4)
Eos: 2 %
Hematocrit: 45.6 % (ref 37.5–51.0)
Hemoglobin: 13.2 g/dL (ref 13.0–17.7)
Immature Grans (Abs): 0 10*3/uL (ref 0.0–0.1)
Immature Granulocytes: 0 %
Lymphocytes Absolute: 1.7 10*3/uL (ref 0.7–3.1)
Lymphs: 41 %
MCH: 18.7 pg — ABNORMAL LOW (ref 26.6–33.0)
MCHC: 28.9 g/dL — ABNORMAL LOW (ref 31.5–35.7)
MCV: 65 fL — ABNORMAL LOW (ref 79–97)
Monocytes Absolute: 0.3 10*3/uL (ref 0.1–0.9)
Monocytes: 7 %
Neutrophils Absolute: 2 10*3/uL (ref 1.4–7.0)
Neutrophils: 49 %
Platelets: 262 10*3/uL (ref 150–450)
RBC: 7.05 x10E6/uL (ref 4.14–5.80)
RDW: 19.5 % — ABNORMAL HIGH (ref 11.6–15.4)
WBC: 4.1 10*3/uL (ref 3.4–10.8)

## 2019-04-19 LAB — LIPID PANEL
Chol/HDL Ratio: 3.4 ratio (ref 0.0–5.0)
Cholesterol, Total: 162 mg/dL (ref 100–199)
HDL: 48 mg/dL (ref >39)
LDL Chol Calc (NIH): 100 mg/dL — ABNORMAL HIGH (ref 0–99)
Triglycerides: 75 mg/dL (ref 0–149)
VLDL Cholesterol Cal: 14 mg/dL (ref 5–40)

## 2019-04-19 LAB — FERRITIN: Ferritin: 658 ng/mL — ABNORMAL HIGH (ref 30–400)

## 2019-04-19 LAB — HEMOGLOBIN A1C
Est. average glucose Bld gHb Est-mCnc: 100 mg/dL
Hgb A1c MFr Bld: 5.1 % (ref 4.8–5.6)

## 2019-04-19 LAB — TSH: TSH: 1.59 u[IU]/mL (ref 0.450–4.500)

## 2019-04-19 NOTE — Telephone Encounter (Signed)
Thank you.  He has an appointment to see me tomorrow.

## 2019-04-19 NOTE — Telephone Encounter (Signed)
Call from lab from [place]. RBC's 7.05

## 2019-04-20 ENCOUNTER — Encounter: Payer: Self-pay | Admitting: Emergency Medicine

## 2019-04-20 ENCOUNTER — Other Ambulatory Visit: Payer: Self-pay

## 2019-04-20 ENCOUNTER — Ambulatory Visit (INDEPENDENT_AMBULATORY_CARE_PROVIDER_SITE_OTHER): Payer: Managed Care, Other (non HMO) | Admitting: Emergency Medicine

## 2019-04-20 VITALS — BP 109/68 | HR 58 | Temp 98.8°F | Resp 16 | Ht 68.0 in | Wt 167.8 lb

## 2019-04-20 DIAGNOSIS — Z0000 Encounter for general adult medical examination without abnormal findings: Secondary | ICD-10-CM

## 2019-04-20 DIAGNOSIS — Z0001 Encounter for general adult medical examination with abnormal findings: Secondary | ICD-10-CM

## 2019-04-20 DIAGNOSIS — R7989 Other specified abnormal findings of blood chemistry: Secondary | ICD-10-CM

## 2019-04-20 DIAGNOSIS — Z862 Personal history of diseases of the blood and blood-forming organs and certain disorders involving the immune mechanism: Secondary | ICD-10-CM | POA: Diagnosis not present

## 2019-04-20 NOTE — Patient Instructions (Addendum)
   If you have lab work done today you will be contacted with your lab results within the next 2 weeks.  If you have not heard from us then please contact us. The fastest way to get your results is to register for My Chart.   IF you received an x-ray today, you will receive an invoice from [facility]. Please contact [facility] at [phone] with questions or concerns regarding your invoice.   IF you received labwork today, you will receive an invoice from [facility]. Please contact [facility] at [phone] with questions or concerns regarding your invoice.   Our billing staff will not be able to assist you with questions regarding bills from these companies.  You will be contacted with the lab results as soon as they are available. The fastest way to get your results is to activate your My Chart account. Instructions are located on the last page of this paperwork. If you have not heard from us regarding the results in 2 weeks, please contact this office.     Health Maintenance, Male Adopting a healthy lifestyle and getting preventive care are important in promoting health and wellness. Ask your health care provider about:  The right schedule for you to have regular tests and exams.  Things you can do on your own to prevent diseases and keep yourself healthy. What should I know about diet, weight, and exercise? Eat a healthy diet   Eat a diet that includes plenty of vegetables, fruits, low-fat dairy products, and lean protein.  Do not eat a lot of foods that are high in solid fats, added sugars, or sodium. Maintain a healthy weight Body mass index (BMI) is a measurement that can be used to identify possible weight problems. It estimates body fat based on height and weight. Your health care provider can help determine your BMI and help you achieve or maintain a healthy weight. Get regular exercise Get regular exercise. This is one of the most important things you  can do for your health. Most adults should:  Exercise for at least 150 minutes each week. The exercise should increase your heart rate and make you sweat (moderate-intensity exercise).  Do strengthening exercises at least twice a week. This is in addition to the moderate-intensity exercise.  Spend less time sitting. Even light physical activity can be beneficial. Watch cholesterol and blood lipids Have your blood tested for lipids and cholesterol at 44 years of age, then have this test every 5 years. You may need to have your cholesterol levels checked more often if:  Your lipid or cholesterol levels are high.  You are older than 44 years of age.  You are at high risk for heart disease. What should I know about cancer screening? Many types of cancers can be detected early and may often be prevented. Depending on your health history and family history, you may need to have cancer screening at various ages. This may include screening for:  Colorectal cancer.  Prostate cancer.  Skin cancer.  Lung cancer. What should I know about heart disease, diabetes, and high blood pressure? Blood pressure and heart disease  High blood pressure causes heart disease and increases the risk of stroke. This is more likely to develop in people who have high blood pressure readings, are of African descent, or are overweight.  Talk with your health care provider about your target blood pressure readings.  Have your blood pressure checked: ? Every 3-5 years if you are 18-39 years   of age. ? Every year if you are 40 years old or older.  If you are between the ages of 65 and 75 and are a current or former smoker, ask your health care provider if you should have a one-time screening for abdominal aortic aneurysm (AAA). Diabetes Have regular diabetes screenings. This checks your fasting blood sugar level. Have the screening done:  Once every three years after age 45 if you are at a normal weight and have  a low risk for diabetes.  More often and at a younger age if you are overweight or have a high risk for diabetes. What should I know about preventing infection? Hepatitis B If you have a higher risk for hepatitis B, you should be screened for this virus. Talk with your health care provider to find out if you are at risk for hepatitis B infection. Hepatitis C Blood testing is recommended for:  Everyone born from 1945 through 1965.  Anyone with known risk factors for hepatitis C. Sexually transmitted infections (STIs)  You should be screened each year for STIs, including gonorrhea and chlamydia, if: ? You are sexually active and are younger than 44 years of age. ? You are older than 44 years of age and your health care provider tells you that you are at risk for this type of infection. ? Your sexual activity has changed since you were last screened, and you are at increased risk for chlamydia or gonorrhea. Ask your health care provider if you are at risk.  Ask your health care provider about whether you are at high risk for HIV. Your health care provider may recommend a prescription medicine to help prevent HIV infection. If you choose to take medicine to prevent HIV, you should first get tested for HIV. You should then be tested every 3 months for as long as you are taking the medicine. Follow these instructions at home: Lifestyle  Do not use any products that contain nicotine or tobacco, such as cigarettes, e-cigarettes, and chewing tobacco. If you need help quitting, ask your health care provider.  Do not use street drugs.  Do not share needles.  Ask your health care provider for help if you need support or information about quitting drugs. Alcohol use  Do not drink alcohol if your health care provider tells you not to drink.  If you drink alcohol: ? Limit how much you have to 0-2 drinks a day. ? Be aware of how much alcohol is in your drink. In the [place] one drink equals one 12  oz bottle of beer (355 mL), one 5 oz glass of wine (148 mL), or one 1 oz glass of hard liquor (44 mL). General instructions  Schedule regular health, dental, and eye exams.  Stay current with your vaccines.  Tell your health care provider if: ? You often feel depressed. ? You have ever been abused or do not feel safe at home. Summary  Adopting a healthy lifestyle and getting preventive care are important in promoting health and wellness.  Follow your health care provider's instructions about healthy diet, exercising, and getting tested or screened for diseases.  Follow your health care provider's instructions on monitoring your cholesterol and blood pressure. This information is not intended to replace advice given to you by your health care provider. Make sure you discuss any questions you have with your health care provider. Document Released: 12/12/2007 Document Revised: 06/08/2018 Document Reviewed: 06/08/2018 Elsevier Patient Education  2020 Elsevier Inc.  

## 2019-04-20 NOTE — Progress Notes (Signed)
Ray Wong 43 y.o.   Chief Complaint  Patient presents with  . Annual Exam    HISTORY OF PRESENT ILLNESS: This is a 43 y.o. male here for his annual exam.  Has the following chronic medical problems: 1.  Thalassemia minor 2.  High ferritin levels Patient has seen hematologist/oncologist in the past and has been worked up for hemochromatosis. Has no complaints or any chronic symptoms.  No medical concerns today. Review of recent blood work however shows increased ferritin and RBC mass.  Advised to follow-up with hematologist.  HPI   Prior to Admission medications   Medication Sig Start Date End Date Taking? Authorizing Provider  dicyclomine (BENTYL) 20 MG tablet Take 1 tablet (20 mg total) by mouth every 6 (six) hours as needed for spasms. 01/16/19   Horald Pollen, MD  omeprazole (PRILOSEC) 40 MG capsule Take 1 capsule (40 mg total) by mouth daily. Patient not taking: Reported on 04/20/2019 01/04/19   Maryruth Hancock, MD    Allergies  Allergen Reactions  . Augmentin [Amoxicillin-Pot Clavulanate] Rash    Diffuse maculopapular rash.    Patient Active Problem List   Diagnosis Date Noted  . Elevated ferritin 09/05/2017  . Thalassemia 05/27/2017  . History of cervical fracture 03/15/2017  . Neutropenia ([place]) 03/15/2017  . Exercise-induced asthma 10/01/2016    Past Medical History:  Diagnosis Date  . Allergy   . Asthma   . Thalassemia     History reviewed. No pertinent surgical history.  Social History   Socioeconomic History  . Marital status: Single    Spouse name: Not on file  . Number of children: Not on file  . Years of education: Not on file  . Highest education level: Not on file  Occupational History  . Not on file  Social Needs  . Financial resource strain: Not on file  . Food insecurity    Worry: Not on file    Inability: Not on file  . Transportation needs    Medical: Not on file    Non-medical: Not on file  Tobacco Use  . Smoking  status: Never Smoker  . Smokeless tobacco: Never Used  Substance and Sexual Activity  . Alcohol use: Yes    Alcohol/week: 0.0 standard drinks  . Drug use: No  . Sexual activity: Not on file  Lifestyle  . Physical activity    Days per week: Not on file    Minutes per session: Not on file  . Stress: Not on file  Relationships  . Social Herbalist on phone: Not on file    Gets together: Not on file    Attends religious service: Not on file    Active member of club or organization: Not on file    Attends meetings of clubs or organizations: Not on file    Relationship status: Not on file  . Intimate partner violence    Fear of current or ex partner: Not on file    Emotionally abused: Not on file    Physically abused: Not on file    Forced sexual activity: Not on file  Other Topics Concern  . Not on file  Social History Narrative  . Not on file    Family History  Problem Relation Age of Onset  . Heart disease Father      Review of Systems  Constitutional: Negative.  Negative for chills and fever.  HENT: Negative.  Negative for congestion and sore throat.  Eyes: Negative.   Respiratory: Negative.  Negative for cough and shortness of breath.   Cardiovascular: Negative.  Negative for chest pain and palpitations.  Gastrointestinal: Negative.  Negative for abdominal pain, blood in stool, diarrhea, melena, nausea and vomiting.  Genitourinary: Negative.  Negative for dysuria and hematuria.  Musculoskeletal: Negative.  Negative for myalgias.  Skin: Negative.  Negative for rash.  Neurological: Negative for dizziness and headaches.  Endo/Heme/Allergies: Negative.   All other systems reviewed and are negative.  Today's Vitals   04/20/19 1441  BP: 109/68  Pulse: (!) 58  Resp: 16  Temp: 98.8 F (37.1 C)  TempSrc: Oral  SpO2: 97%  Weight: 167 lb 12.8 oz (76.1 kg)  Height: 5\' 8"  (1.727 m)   Body mass index is 25.51 kg/m.   Physical Exam Vitals signs reviewed.   Constitutional:      Appearance: Normal appearance.  HENT:     Head: Normocephalic.  Eyes:     Extraocular Movements: Extraocular movements intact.     Conjunctiva/sclera: Conjunctivae normal.     Pupils: Pupils are equal, round, and reactive to light.  Neck:     Musculoskeletal: Normal range of motion and neck supple.  Cardiovascular:     Rate and Rhythm: Normal rate and regular rhythm.     Pulses: Normal pulses.     Heart sounds: Normal heart sounds.  Pulmonary:     Effort: Pulmonary effort is normal.     Breath sounds: Normal breath sounds.  Abdominal:     General: Abdomen is flat. There is no distension.     Palpations: Abdomen is soft. There is no mass.     Tenderness: There is no abdominal tenderness.  Musculoskeletal: Normal range of motion.  Skin:    General: Skin is warm and dry.     Capillary Refill: Capillary refill takes less than 2 seconds.  Neurological:     General: No focal deficit present.     Mental Status: He is alert and oriented to person, place, and time.  Psychiatric:        Mood and Affect: Mood normal.        Behavior: Behavior normal.      ASSESSMENT & PLAN: Ray RuizJohn was seen today for annual exam.  Diagnoses and all orders for this visit:  Routine general medical examination at a health care facility  History of thalassemia minor  Elevated ferritin   Advised to follow-up with his hematologist sometime in the near future. Patient Instructions       If you have lab work done today you will be contacted with your lab results within the next 2 weeks.  If you have not heard from us then please contact us. The fastest way to get your results is to register for My Chart.   IF you received an x-ray today, you will receive an invoice from [facility]. Please contact [facility] at [phone] with questions or concerns regarding your invoice.   IF you received labwork today, you will receive an invoice from [facility]. Please  contact [facility] at [phone] with questions or concerns regarding your invoice.   Our billing staff will not be able to assist you with questions regarding bills from these companies.  You will be contacted with the lab results as soon as they are available. The fastest way to get your results is to activate your My Chart account. Instructions are located on the last page of this paperwork. If you have not heard from us  regarding the results in 2 weeks, please contact this office.      Health Maintenance, Male Adopting a healthy lifestyle and getting preventive care are important in promoting health and wellness. Ask your health care provider about:  The right schedule for you to have regular tests and exams.  Things you can do on your own to prevent diseases and keep yourself healthy. What should I know about diet, weight, and exercise? Eat a healthy diet   Eat a diet that includes plenty of vegetables, fruits, low-fat dairy products, and lean protein.  Do not eat a lot of foods that are high in solid fats, added sugars, or sodium. Maintain a healthy weight Body mass index (BMI) is a measurement that can be used to identify possible weight problems. It estimates body fat based on height and weight. Your health care provider can help determine your BMI and help you achieve or maintain a healthy weight. Get regular exercise Get regular exercise. This is one of the most important things you can do for your health. Most adults should:  Exercise for at least 150 minutes each week. The exercise should increase your heart rate and make you sweat (moderate-intensity exercise).  Do strengthening exercises at least twice a week. This is in addition to the moderate-intensity exercise.  Spend less time sitting. Even light physical activity can be beneficial. Watch cholesterol and blood lipids Have your blood tested for lipids and cholesterol at 44 years of age, then have this test every 5  years. You may need to have your cholesterol levels checked more often if:  Your lipid or cholesterol levels are high.  You are older than 44 years of age.  You are at high risk for heart disease. What should I know about cancer screening? Many types of cancers can be detected early and may often be prevented. Depending on your health history and family history, you may need to have cancer screening at various ages. This may include screening for:  Colorectal cancer.  Prostate cancer.  Skin cancer.  Lung cancer. What should I know about heart disease, diabetes, and high blood pressure? Blood pressure and heart disease  High blood pressure causes heart disease and increases the risk of stroke. This is more likely to develop in people who have high blood pressure readings, are of African descent, or are overweight.  Talk with your health care provider about your target blood pressure readings.  Have your blood pressure checked: ? Every 3-5 years if you are 12-63 years of age. ? Every year if you are 20 years old or older.  If you are between the ages of 59 and 25 and are a current or former smoker, ask your health care provider if you should have a one-time screening for abdominal aortic aneurysm (AAA). Diabetes Have regular diabetes screenings. This checks your fasting blood sugar level. Have the screening done:  Once every three years after age 33 if you are at a normal weight and have a low risk for diabetes.  More often and at a younger age if you are overweight or have a high risk for diabetes. What should I know about preventing infection? Hepatitis B If you have a higher risk for hepatitis B, you should be screened for this virus. Talk with your health care provider to find out if you are at risk for hepatitis B infection. Hepatitis C Blood testing is recommended for:  Everyone born from [birth year] through 1965.  Anyone with known risk  factors for hepatitis C. Sexually  transmitted infections (STIs)  You should be screened each year for STIs, including gonorrhea and chlamydia, if: ? You are sexually active and are younger than 44 years of age. ? You are older than 44 years of age and your health care provider tells you that you are at risk for this type of infection. ? Your sexual activity has changed since you were last screened, and you are at increased risk for chlamydia or gonorrhea. Ask your health care provider if you are at risk.  Ask your health care provider about whether you are at high risk for HIV. Your health care provider may recommend a prescription medicine to help prevent HIV infection. If you choose to take medicine to prevent HIV, you should first get tested for HIV. You should then be tested every 3 months for as long as you are taking the medicine. Follow these instructions at home: Lifestyle  Do not use any products that contain nicotine or tobacco, such as cigarettes, e-cigarettes, and chewing tobacco. If you need help quitting, ask your health care provider.  Do not use street drugs.  Do not share needles.  Ask your health care provider for help if you need support or information about quitting drugs. Alcohol use  Do not drink alcohol if your health care provider tells you not to drink.  If you drink alcohol: ? Limit how much you have to 0-2 drinks a day. ? Be aware of how much alcohol is in your drink. In the [place] one drink equals one 12 oz bottle of beer (355 mL), one 5 oz glass of wine (148 mL), or one 1 oz glass of hard liquor (44 mL). General instructions  Schedule regular health, dental, and eye exams.  Stay current with your vaccines.  Tell your health care provider if: ? You often feel depressed. ? You have ever been abused or do not feel safe at home. Summary  Adopting a healthy lifestyle and getting preventive care are important in promoting health and wellness.  Follow your health care provider's  instructions about healthy diet, exercising, and getting tested or screened for diseases.  Follow your health care provider's instructions on monitoring your cholesterol and blood pressure. This information is not intended to replace advice given to you by your health care provider. Make sure you discuss any questions you have with your health care provider. Document Released: 12/12/2007 Document Revised: 06/08/2018 Document Reviewed: 06/08/2018 Elsevier Patient Education  2020 Elsevier Inc.      Edwina Barth, MD [facility]

## 2020-06-10 ENCOUNTER — Other Ambulatory Visit: Payer: Self-pay

## 2020-06-10 ENCOUNTER — Ambulatory Visit: Payer: Managed Care, Other (non HMO) | Admitting: Emergency Medicine

## 2020-06-10 ENCOUNTER — Encounter: Payer: Self-pay | Admitting: Emergency Medicine

## 2020-06-10 ENCOUNTER — Telehealth: Payer: 59 | Admitting: Physician Assistant

## 2020-06-10 ENCOUNTER — Ambulatory Visit (INDEPENDENT_AMBULATORY_CARE_PROVIDER_SITE_OTHER): Payer: Managed Care, Other (non HMO)

## 2020-06-10 VITALS — BP 116/76 | HR 65 | Temp 98.0°F | Resp 16 | Ht 68.0 in | Wt 166.0 lb

## 2020-06-10 DIAGNOSIS — M549 Dorsalgia, unspecified: Secondary | ICD-10-CM

## 2020-06-10 DIAGNOSIS — M545 Low back pain, unspecified: Secondary | ICD-10-CM | POA: Diagnosis not present

## 2020-06-10 DIAGNOSIS — R531 Weakness: Secondary | ICD-10-CM

## 2020-06-10 DIAGNOSIS — S39012A Strain of muscle, fascia and tendon of lower back, initial encounter: Secondary | ICD-10-CM

## 2020-06-10 DIAGNOSIS — M62830 Muscle spasm of back: Secondary | ICD-10-CM | POA: Diagnosis not present

## 2020-06-10 MED ORDER — CYCLOBENZAPRINE HCL 10 MG PO TABS: 10.0000 mg | ORAL_TABLET | Freq: Every day | ORAL | 0 refills | Status: AC

## 2020-06-10 MED ORDER — MELOXICAM 15 MG PO TABS: 15.0000 mg | ORAL_TABLET | Freq: Every day | ORAL | 1 refills | Status: AC

## 2020-06-10 NOTE — Progress Notes (Signed)
°  Good morning Ray Wong,   I am sorry you are having back pain. I am concerned about some of the symptoms you mentioned, including severe pain, weakness and numbness.  Anytime these symptoms are present, we are concerned about a possible nerve problem.  Unfortunately, this is too complicated of a problem to take care of via an E-visit.  You will need to be seen in person to have a physical exam and possible imaging.  Based on what you shared with me, I feel your condition warrants further evaluation and I recommend that you be seen for a face to face office visit.   NOTE: If you entered your credit card information for this eVisit, you will not be charged. You may see a "hold" on your card for the $35 but that hold will drop off and you will not have a charge processed.   If you are having a true medical emergency please call 911.      For an urgent face to face visit, [employer] [facility] has five urgent care centers for your convenience:     [facility] at [facility] Directions [phone] [address] [place], [place] [address]  10 am - 6pm Monday - Friday    [facility]) Get Driving Directions [phone] [address] [place], [place] [address]  10 am to 8 pm Monday-Friday  12 pm to 8 pm [facility] Urgent Care at [facility] Get Driving Directions [phone] [place], [place] [address]  8 am to 8 pm Monday-Friday  9 am to 6 pm Saturday  11 am to 6 pm Sunday     [facility] Urgent Care at [facility] Get Driving Directions  [phone] [address] [place], [place] [address]  8 am to 8 pm Monday-Friday  8 am to 4 pm [facility] Urgent Care at [facility] Directions [phone] [address] [place], [place] [address]  12 pm to 6 pm Monday-Friday      Your e-visit answers  were reviewed by a board certified advanced clinical practitioner to complete your personal care plan.  Thank you for using e-Visits.

## 2020-06-10 NOTE — Patient Instructions (Addendum)
   If you have lab work done today you will be contacted with your lab results within the next 2 weeks.  If you have not heard from us then please contact us. The fastest way to get your results is to register for My Chart.   IF you received an x-ray today, you will receive an invoice from [facility]. Please contact [facility] at [phone] with questions or concerns regarding your invoice.   IF you received labwork today, you will receive an invoice from [facility]. Please contact [facility] at [phone] with questions or concerns regarding your invoice.   Our billing staff will not be able to assist you with questions regarding bills from these companies.  You will be contacted with the lab results as soon as they are available. The fastest way to get your results is to activate your My Chart account. Instructions are located on the last page of this paperwork. If you have not heard from us regarding the results in 2 weeks, please contact this office.     Acute Back Pain, Adult Acute back pain is sudden and usually short-lived. It is often caused by an injury to the muscles and tissues in the back. The injury may result from:  A muscle or ligament getting overstretched or torn (strained). Ligaments are tissues that connect bones to each other. Lifting something improperly can cause a back strain.  Wear and tear (degeneration) of the spinal disks. Spinal disks are circular tissue that provides cushioning between the bones of the spine (vertebrae).  Twisting motions, such as while playing sports or doing yard work.  A hit to the back.  Arthritis. You may have a physical exam, lab tests, and imaging tests to find the cause of your pain. Acute back pain usually goes away with rest and home care. Follow these instructions at home: Managing pain, stiffness, and swelling  Take over-the-counter and prescription medicines only as told by your health care  provider.  Your health care provider may recommend applying ice during the first 24-48 hours after your pain starts. To do this: ? Put ice in a plastic bag. ? Place a towel between your skin and the bag. ? Leave the ice on for 20 minutes, 2-3 times a day.  If directed, apply heat to the affected area as often as told by your health care provider. Use the heat source that your health care provider recommends, such as a moist heat pack or a heating pad. ? Place a towel between your skin and the heat source. ? Leave the heat on for 20-30 minutes. ? Remove the heat if your skin turns bright red. This is especially important if you are unable to feel pain, heat, or cold. You have a greater risk of getting burned. Activity   Do not stay in bed. Staying in bed for more than 1-2 days can delay your recovery.  Sit up and stand up straight. Avoid leaning forward when you sit, or hunching over when you stand. ? If you work at a desk, sit close to it so you do not need to lean over. Keep your chin tucked in. Keep your neck drawn back, and keep your elbows bent at a right angle. Your arms should look like the letter "L." ? Sit high and close to the steering wheel when you drive. Add lower back (lumbar) support to your car seat, if needed.  Take short walks on even surfaces as soon as you are able. Try   to increase the length of time you walk each day.  Do not sit, drive, or stand in one place for more than 30 minutes at a time. Sitting or standing for long periods of time can put stress on your back.  Do not drive or use heavy machinery while taking prescription pain medicine.  Use proper lifting techniques. When you bend and lift, use positions that put less stress on your back: ? Bend your knees. ? Keep the load close to your body. ? Avoid twisting.  Exercise regularly as told by your health care provider. Exercising helps your back heal faster and helps prevent back injuries by keeping muscles  strong and flexible.  Work with a physical therapist to make a safe exercise program, as recommended by your health care provider. Do any exercises as told by your physical therapist. Lifestyle  Maintain a healthy weight. Extra weight puts stress on your back and makes it difficult to have good posture.  Avoid activities or situations that make you feel anxious or stressed. Stress and anxiety increase muscle tension and can make back pain worse. Learn ways to manage anxiety and stress, such as through exercise. General instructions  Sleep on a firm mattress in a comfortable position. Try lying on your side with your knees slightly bent. If you lie on your back, put a pillow under your knees.  Follow your treatment plan as told by your health care provider. This may include: ? Cognitive or behavioral therapy. ? Acupuncture or massage therapy. ? Meditation or yoga. Contact a health care provider if:  You have pain that is not relieved with rest or medicine.  You have increasing pain going down into your legs or buttocks.  Your pain does not improve after 2 weeks.  You have pain at night.  You lose weight without trying.  You have a fever or chills. Get help right away if:  You develop new bowel or bladder control problems.  You have unusual weakness or numbness in your arms or legs.  You develop nausea or vomiting.  You develop abdominal pain.  You feel faint. Summary  Acute back pain is sudden and usually short-lived.  Use proper lifting techniques. When you bend and lift, use positions that put less stress on your back.  Take over-the-counter and prescription medicines and apply heat or ice as directed by your health care provider. This information is not intended to replace advice given to you by your health care provider. Make sure you discuss any questions you have with your health care provider. Document Revised: 10/04/2018 Document Reviewed: 01/27/2017 Elsevier  Patient Education  2020 Elsevier Inc.  

## 2020-06-10 NOTE — Progress Notes (Signed)
Ray PouchJohn L Wong 44 y.o.   Chief Complaint  Patient presents with   Back Pain    Per patient lower area and on Friday 06/07/2020 after lifting a mirror pain started    HISTORY OF PRESENT ILLNESS: This is a 44 y.o. male complaining of left-sided lumbar pain that started 3 days ago after lifting a member.  Sharp constant pain with some radiation down the left leg without any other associated symptoms.  Had similar episode several weeks ago.  No history of chronic low back pain.  Denies any abdominal pain.  Denies any urinary symptoms.  Denies bowel or bladder dysfunction. No other complaints or medical concerns today.  HPI   Prior to Admission medications   Medication Sig Start Date End Date Taking? Authorizing Provider  dicyclomine (BENTYL) 20 MG tablet Take 1 tablet (20 mg total) by mouth every 6 (six) hours as needed for spasms. 01/16/19   Georgina QuintSagardia, Bradrick Kamau Jose, MD  omeprazole (PRILOSEC) 40 MG capsule Take 1 capsule (40 mg total) by mouth daily. Patient not taking: Reported on 04/20/2019 01/04/19   Wandra Feinsteinorum, Lisa L, MD    Allergies  Allergen Reactions   Augmentin [Amoxicillin-Pot Clavulanate] Rash    Diffuse maculopapular rash.    Patient Active Problem List   Diagnosis Date Noted   Elevated ferritin 09/05/2017   Thalassemia 05/27/2017   History of cervical fracture 03/15/2017   Neutropenia (HCC) 03/15/2017   Exercise-induced asthma 10/01/2016    Past Medical History:  Diagnosis Date   Allergy    Asthma    Thalassemia     No past surgical history on file.  Social History   Socioeconomic History   Marital status: Single    Spouse name: Not on file   Number of children: Not on file   Years of education: Not on file   Highest education level: Not on file  Occupational History   Not on file  Tobacco Use   Smoking status: Never Smoker   Smokeless tobacco: Never Used  Vaping Use   Vaping Use: Never used  Substance and Sexual Activity   Alcohol  use: Yes    Alcohol/week: 0.0 standard drinks    Comment: 3-5   Drug use: No   Sexual activity: Not on file  Other Topics Concern   Not on file  Social History Narrative   Not on file   Social Determinants of Health   Financial Resource Strain: Not on file  Food Insecurity: Not on file  Transportation Needs: Not on file  Physical Activity: Not on file  Stress: Not on file  Social Connections: Not on file  Intimate Partner Violence: Not on file    Family History  Problem Relation Age of Onset   Heart disease Father        AFib   Cancer Maternal Grandfather    Cancer Paternal Grandmother    Heart disease Paternal Grandfather      Review of Systems  Constitutional: Negative.  Negative for chills and fever.  HENT: Negative.  Negative for congestion and sore throat.   Respiratory: Negative.  Negative for cough and shortness of breath.   Cardiovascular: Negative.  Negative for chest pain and palpitations.  Gastrointestinal: Negative.  Negative for abdominal pain, diarrhea, nausea and vomiting.  Genitourinary: Negative.  Negative for dysuria, frequency, hematuria and urgency.  Musculoskeletal: Positive for back pain.  Skin: Negative.  Negative for rash.  Neurological: Negative.  Negative for dizziness and headaches.  All other systems reviewed and  are negative.    Today's Vitals   06/10/20 0907  BP: 116/76  Pulse: 65  Resp: 16  Temp: 98 F (36.7 C)  TempSrc: Temporal  SpO2: 99%  Weight: 166 lb (75.3 kg)  Height: 5\' 8"  (1.727 m)   Body mass index is 25.24 kg/m.   Physical Exam Vitals reviewed.  Constitutional:      Appearance: Normal appearance.  HENT:     Head: Normocephalic.  Eyes:     Extraocular Movements: Extraocular movements intact.     Pupils: Pupils are equal, round, and reactive to light.  Cardiovascular:     Rate and Rhythm: Normal rate and regular rhythm.  Pulmonary:     Effort: Pulmonary effort is normal.     Breath sounds: Normal  breath sounds.  Abdominal:     General: Bowel sounds are normal. There is no distension.     Palpations: Abdomen is soft.     Tenderness: There is no abdominal tenderness.  Musculoskeletal:     Cervical back: Normal range of motion.     Lumbar back: Spasms and tenderness present. No bony tenderness. Decreased range of motion. Positive left straight leg raise test.  Skin:    General: Skin is warm and dry.     Capillary Refill: Capillary refill takes less than 2 seconds.  Neurological:     General: No focal deficit present.     Mental Status: He is alert and oriented to person, place, and time.  Psychiatric:        Mood and Affect: Mood normal.        Behavior: Behavior normal.    DG Lumbar Spine 2-3 Views  Result Date: 06/10/2020 CLINICAL DATA:  Lumbar pain EXAM: LUMBAR SPINE - 2-3 VIEW COMPARISON:  None FINDINGS: There is no evidence of lumbar spine fracture. Alignment is normal. Intervertebral disc spaces are maintained. IMPRESSION: Negative. Electronically Signed   By: 06/12/2020 M.D.   On: 06/10/2020 09:59     ASSESSMENT & PLAN: Asif was seen today for back pain.  Diagnoses and all orders for this visit:  Acute myofascial strain of lumbar region, initial encounter -     DG Lumbar Spine 2-3 Views  Lumbar pain  Musculoskeletal back pain -     meloxicam (MOBIC) 15 MG tablet; Take 1 tablet (15 mg total) by mouth daily for 7 days.  Muscle spasm of back -     cyclobenzaprine (FLEXERIL) 10 MG tablet; Take 1 tablet (10 mg total) by mouth at bedtime.    Patient Instructions       If you have lab work done today you will be contacted with your lab results within the next 2 weeks.  If you have not heard from Jonny Ruiz then please contact us. The fastest way to get your results is to register for My Chart.   IF you received an x-Ray today, you will receive an invoice from [facility]. Please contact [facility] at [phone] with questions or concerns  regarding your invoice.   IF you received labwork today, you will receive an invoice from [facility]. Please contact [facility] at [phone] with questions or concerns regarding your invoice.   Our billing staff will not be able to assist you with questions regarding bills from these companies.  You will be contacted with the lab results as soon as they are available. The fastest way to get your results is to activate your My Chart account. Instructions are located on the last page of  this paperwork. If you have not heard from [place] regarding the results in 2 weeks, please contact this office.     Acute Back Pain, Adult Acute back pain is sudden and usually short-lived. It is often caused by an injury to the muscles and tissues in the back. The injury may result from:  A muscle or ligament getting overstretched or torn (strained). Ligaments are tissues that connect bones to each other. Lifting something improperly can cause a back strain.  Wear and tear (degeneration) of the spinal disks. Spinal disks are circular tissue that provides cushioning between the bones of the spine (vertebrae).  Twisting motions, such as while playing sports or doing yard work.  A hit to the back.  Arthritis. You may have a physical exam, lab tests, and imaging tests to find the cause of your pain. Acute back pain usually goes away with rest and home care. Follow these instructions at home: Managing pain, stiffness, and swelling  Take over-the-counter and prescription medicines only as told by your health care provider.  Your health care provider may recommend applying ice during the first 24-48 hours after your pain starts. To do this: ? Put ice in a plastic bag. ? Place a towel between your skin and the bag. ? Leave the ice on for 20 minutes, 2-3 times a day.  If directed, apply heat to the affected area as often as told by your health care provider. Use the heat source that your health care provider  recommends, such as a moist heat pack or a heating pad. ? Place a towel between your skin and the heat source. ? Leave the heat on for 20-30 minutes. ? Remove the heat if your skin turns bright red. This is especially important if you are unable to feel pain, heat, or cold. You have a greater risk of getting burned. Activity   Do not stay in bed. Staying in bed for more than 1-2 days can delay your recovery.  Sit up and stand up straight. Avoid leaning forward when you sit, or hunching over when you stand. ? If you work at a desk, sit close to it so you do not need to lean over. Keep your chin tucked in. Keep your neck drawn back, and keep your elbows bent at a right angle. Your arms should look like the letter "L." ? Sit high and close to the steering wheel when you drive. Add lower back (lumbar) support to your car seat, if needed.  Take short walks on even surfaces as soon as you are able. Try to increase the length of time you walk each day.  Do not sit, drive, or stand in one place for more than 30 minutes at a time. Sitting or standing for long periods of time can put stress on your back.  Do not drive or use heavy machinery while taking prescription pain medicine.  Use proper lifting techniques. When you bend and lift, use positions that put less stress on your back: ? [place] your knees. ? Keep the load close to your body. ? Avoid twisting.  Exercise regularly as told by your health care provider. Exercising helps your back heal faster and helps prevent back injuries by keeping muscles strong and flexible.  Work with a physical therapist to make a safe exercise program, as recommended by your health care provider. Do any exercises as told by your physical therapist. Lifestyle  Maintain a healthy weight. Extra weight puts stress on your back and makes  it difficult to have good posture.  Avoid activities or situations that make you feel anxious or stressed. Stress and anxiety  increase muscle tension and can make back pain worse. Learn ways to manage anxiety and stress, such as through exercise. General instructions  Sleep on a firm mattress in a comfortable position. Try lying on your side with your knees slightly bent. If you lie on your back, put a pillow under your knees.  Follow your treatment plan as told by your health care provider. This may include: ? Cognitive or behavioral therapy. ? Acupuncture or massage therapy. ? Meditation or yoga. Contact a health care provider if:  You have pain that is not relieved with rest or medicine.  You have increasing pain going down into your legs or buttocks.  Your pain does not improve after 2 weeks.  You have pain at night.  You lose weight without trying.  You have a fever or chills. Get help right away if:  You develop new bowel or bladder control problems.  You have unusual weakness or numbness in your arms or legs.  You develop nausea or vomiting.  You develop abdominal pain.  You feel faint. Summary  Acute back pain is sudden and usually short-lived.  Use proper lifting techniques. When you bend and lift, use positions that put less stress on your back.  Take over-the-counter and prescription medicines and apply heat or ice as directed by your health care provider. This information is not intended to replace advice given to you by your health care provider. Make sure you discuss any questions you have with your health care provider. Document Revised: 10/04/2018 Document Reviewed: 01/27/2017 Elsevier Patient Education  2020 Elsevier Inc.      Edwina Barth, MD [facility]

## 2021-01-09 IMAGING — US ULTRASOUND ABDOMEN LIMITED
1 series · 14 of 25 positions shown · non-contrast
Comparison: None.

CLINICAL DATA: Abdominal pain, bloating, nausea

EXAM:
ULTRASOUND ABDOMEN LIMITED RIGHT UPPER QUADRANT

[Series 1: ultrasound abdomen limited · 14 of 47 slices shown]
[im 1/47]
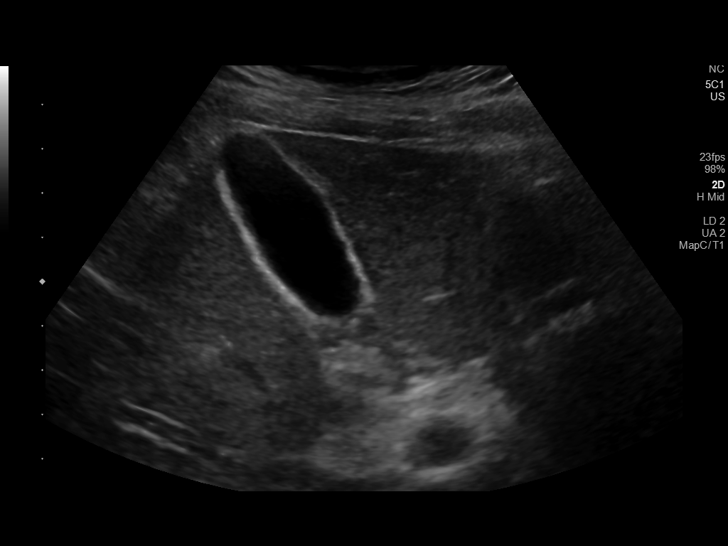
[im 4/47]
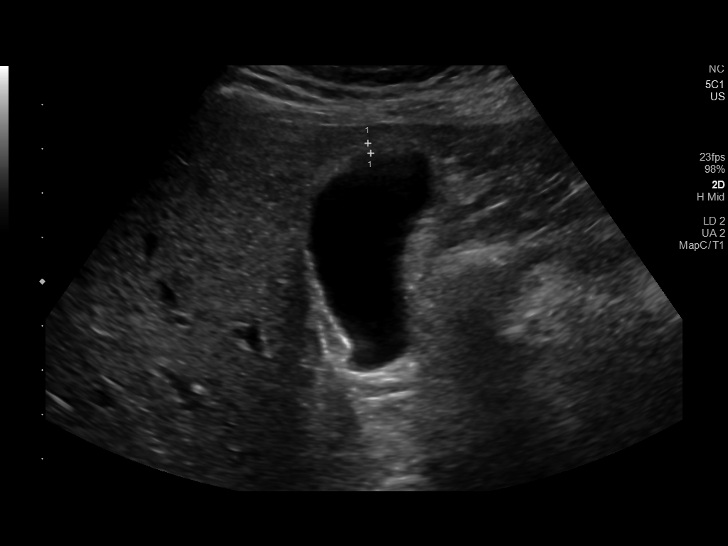
[im 8/47]
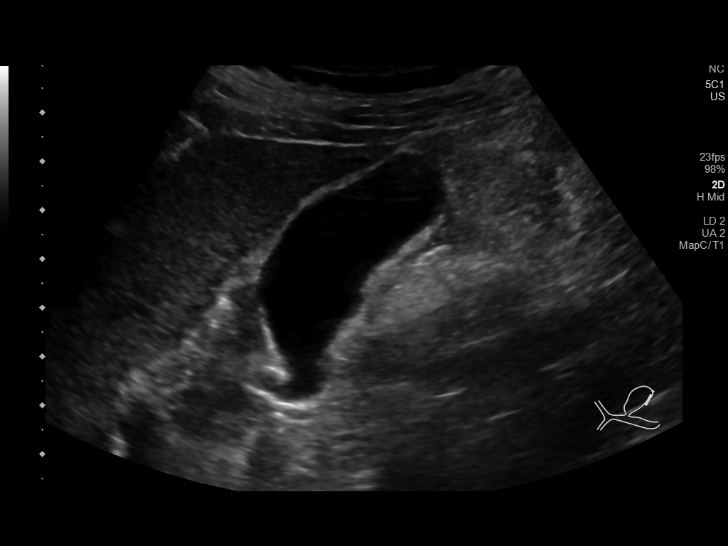
[im 12/47]
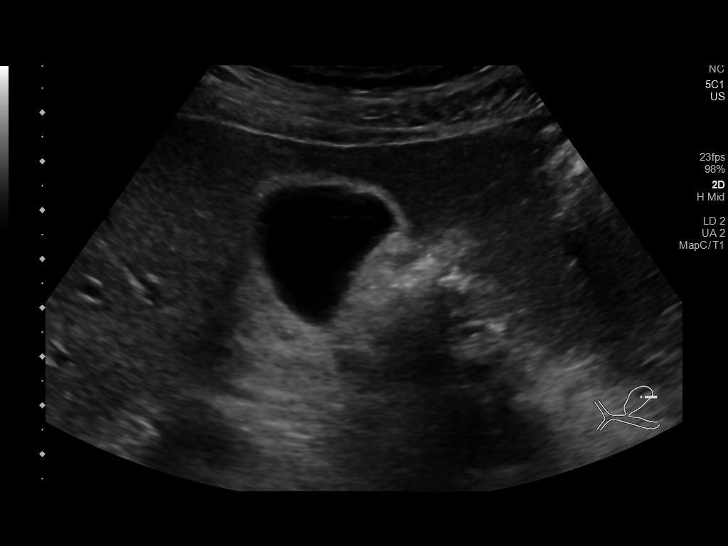
[im 16/47]
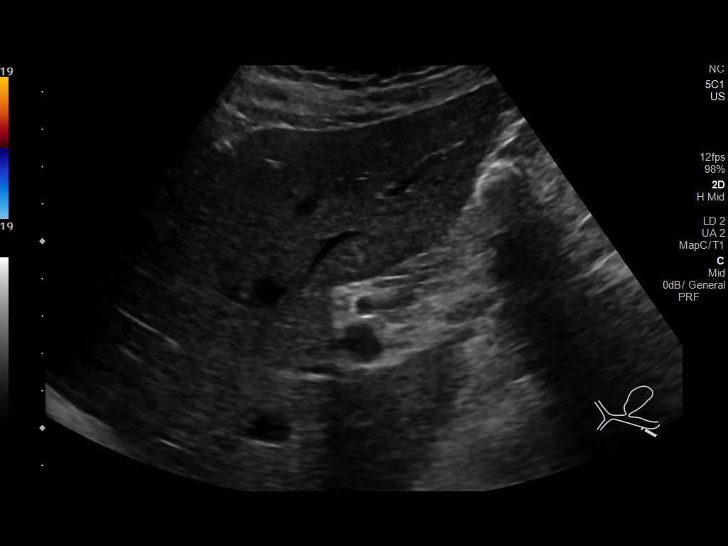
[im 18/47]
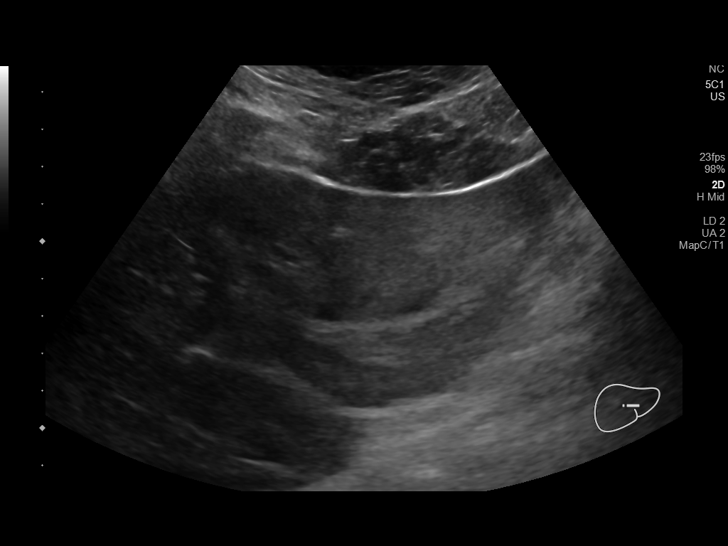
[im 22/47]
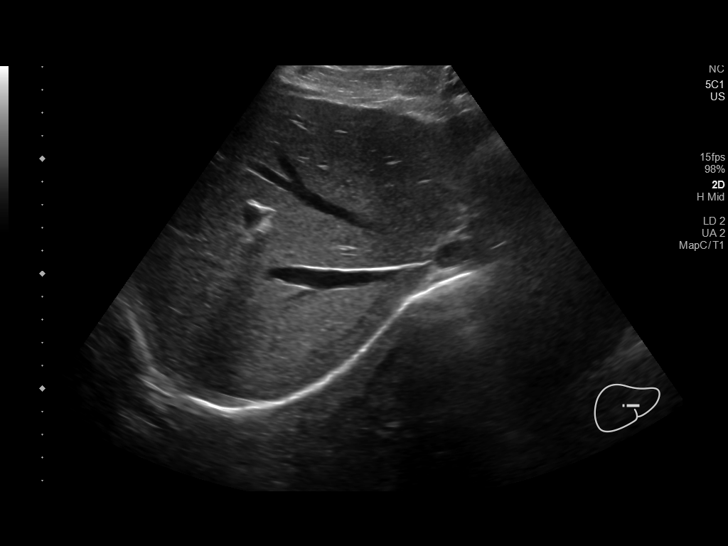
[im 25/47]
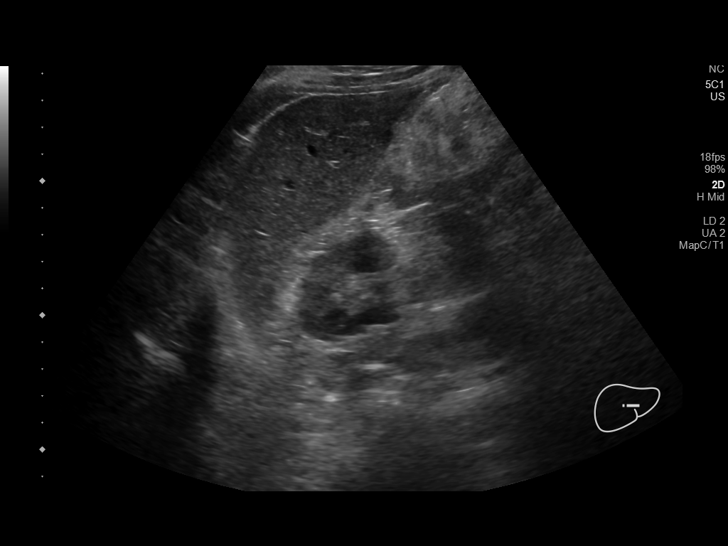
[im 29/47]
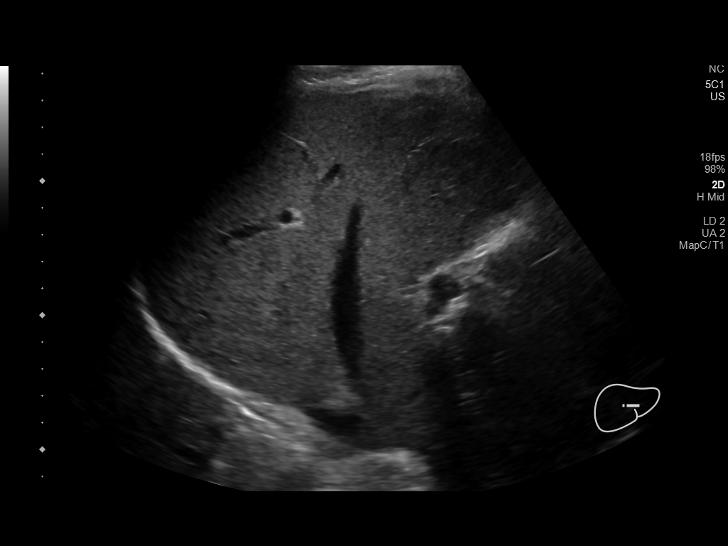
[im 31/47]
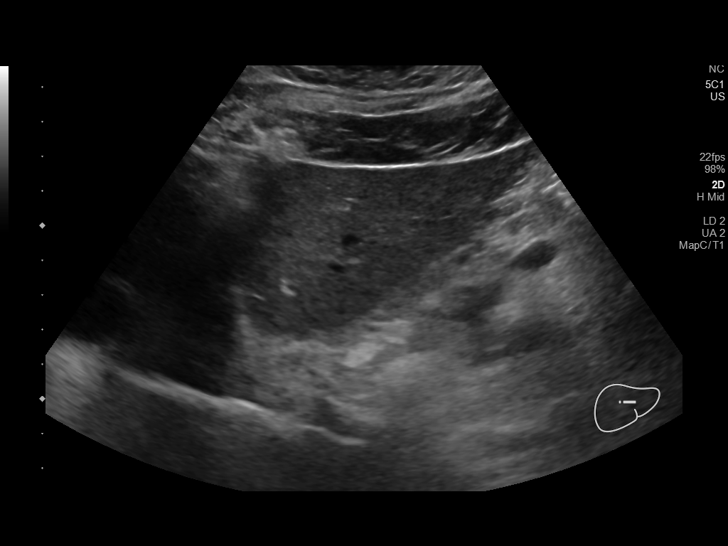
[im 35/47]
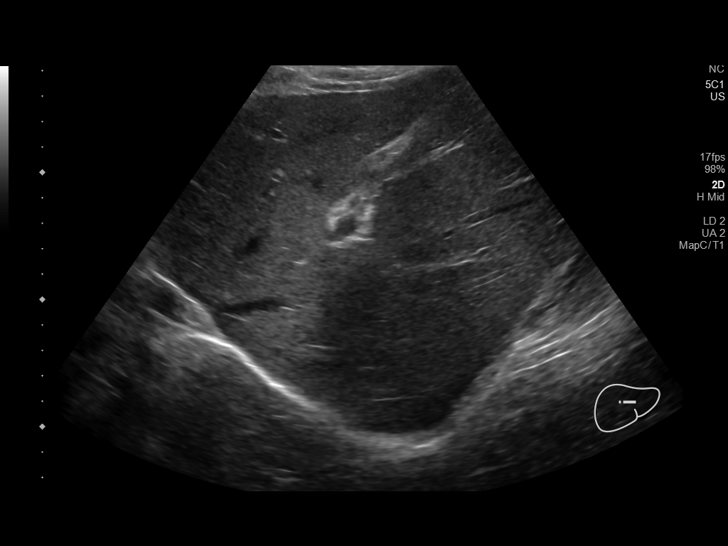
[im 39/47]
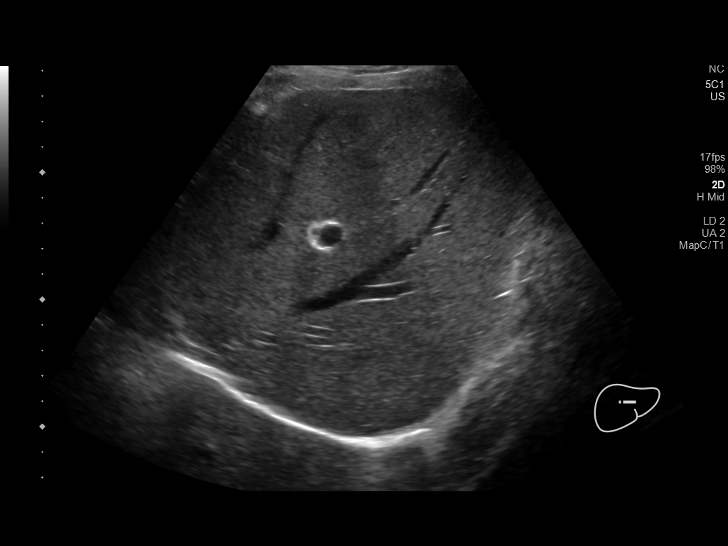
[im 43/47]
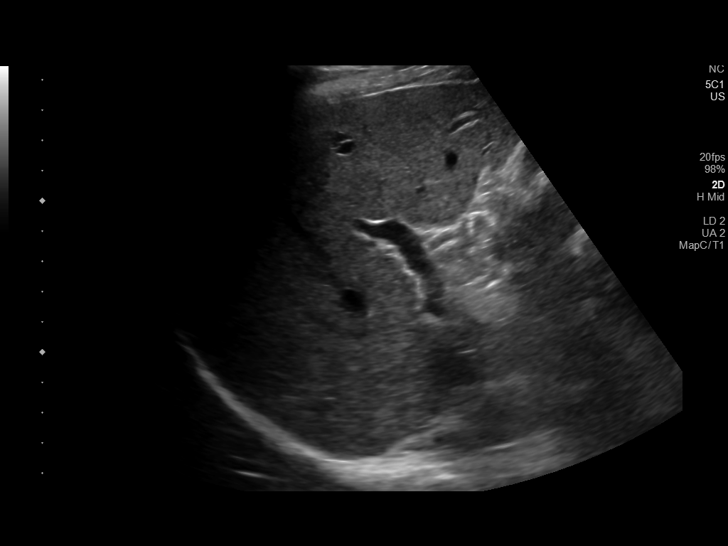
[im 47/47]
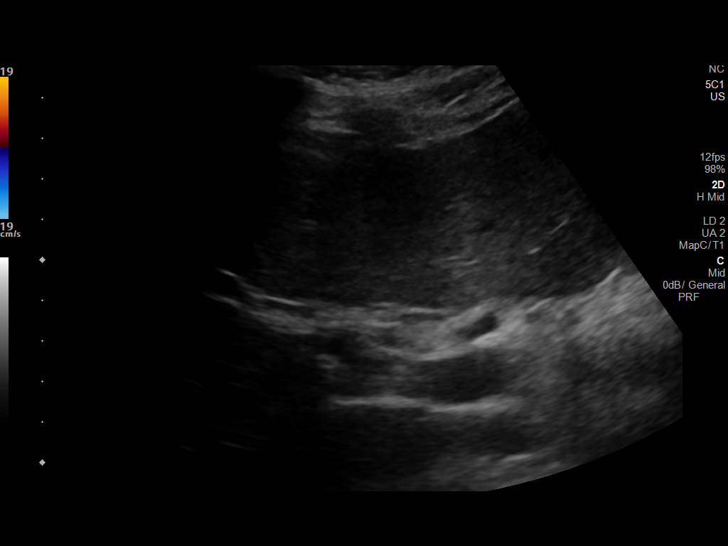

[14 of 25 positions shown; findings below may reference images not displayed]

FINDINGS: Gallbladder:

No gallstones or wall thickening visualized. No sonographic Murphy
sign noted by sonographer.

Common bile duct:

Diameter: Normal caliber, 2 mm

Liver:

No focal lesion identified. Within normal limits in parenchymal
echogenicity. Portal vein is patent on color Doppler imaging with
normal direction of blood flow towards the liver.
IMPRESSION: Normal right upper quadrant ultrasound.

## 2022-06-02 IMAGING — DX DG LUMBAR SPINE 2-3V
3 series · 3 of 3 positions shown · non-contrast
Comparison: None

CLINICAL DATA: Lumbar pain

EXAM:
LUMBAR SPINE - 2-3 VIEW

[l-spine ap]
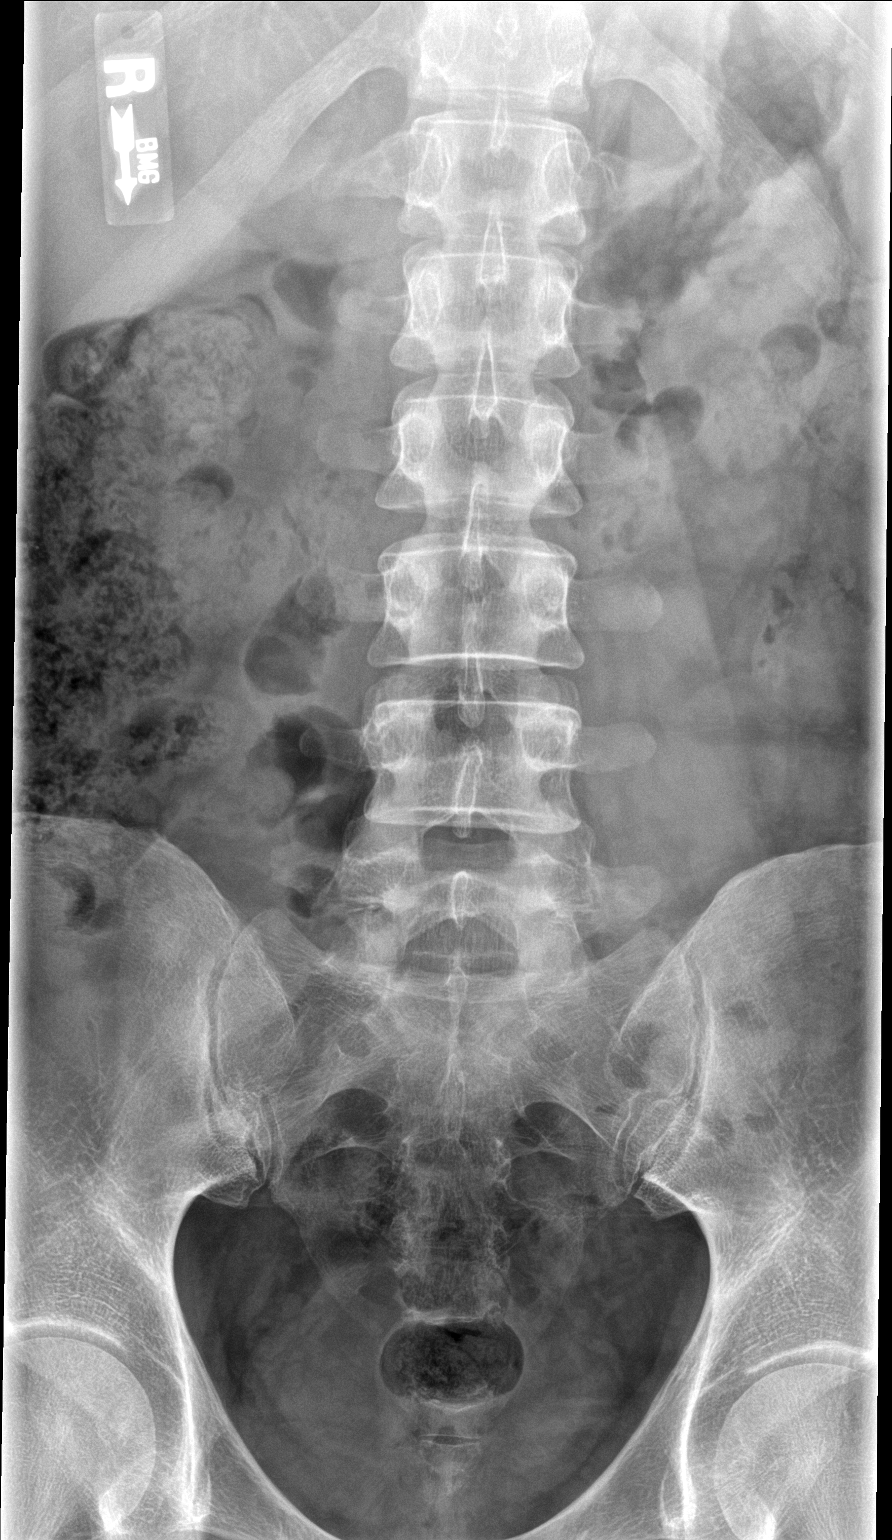

[l-spine lat]
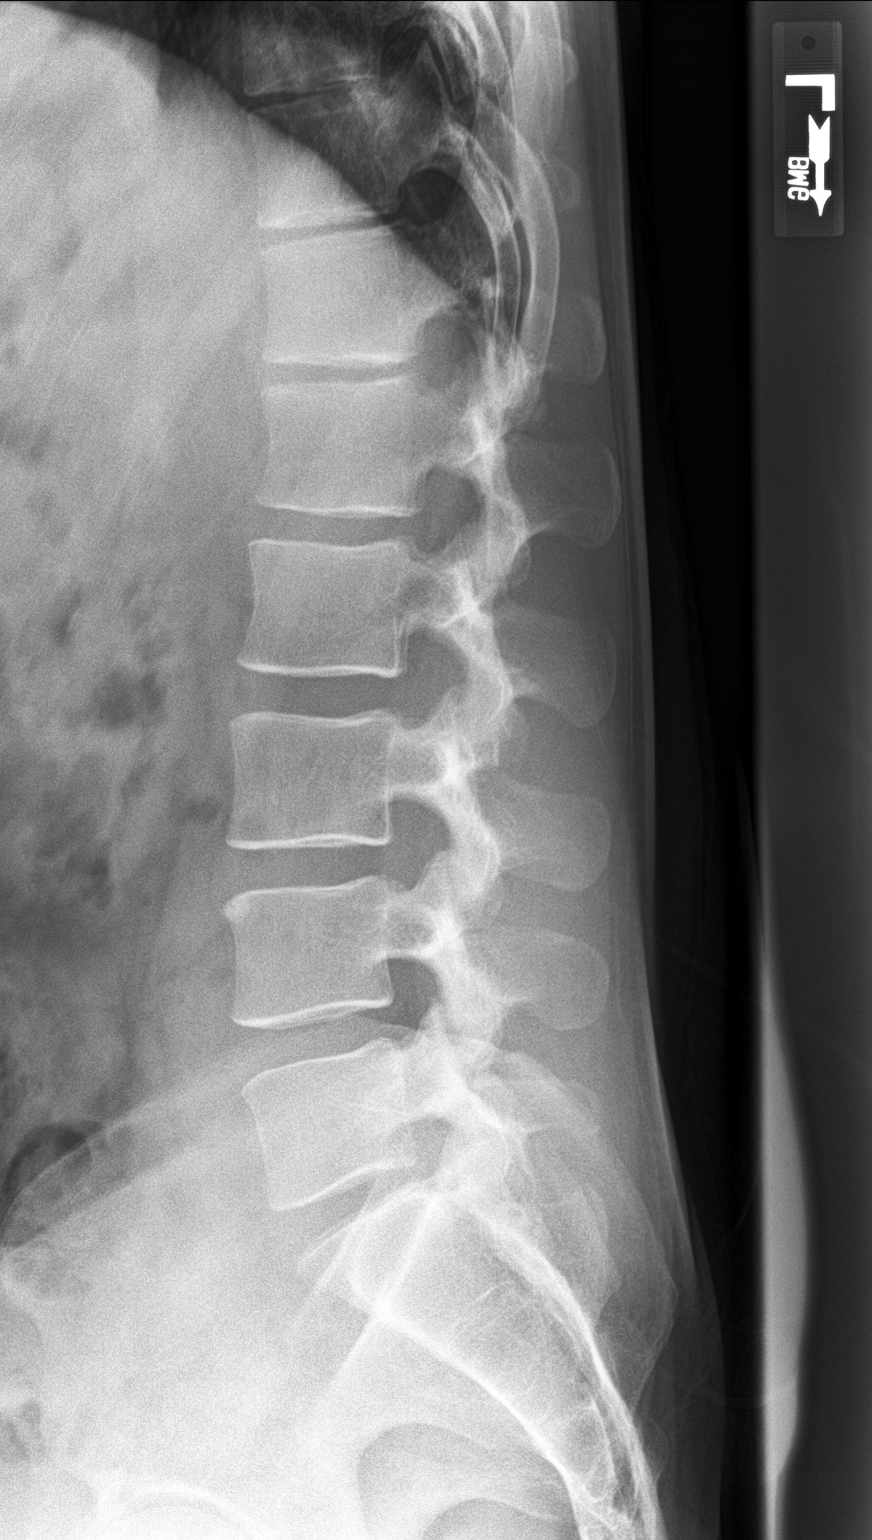

[l-spine l5-s1]
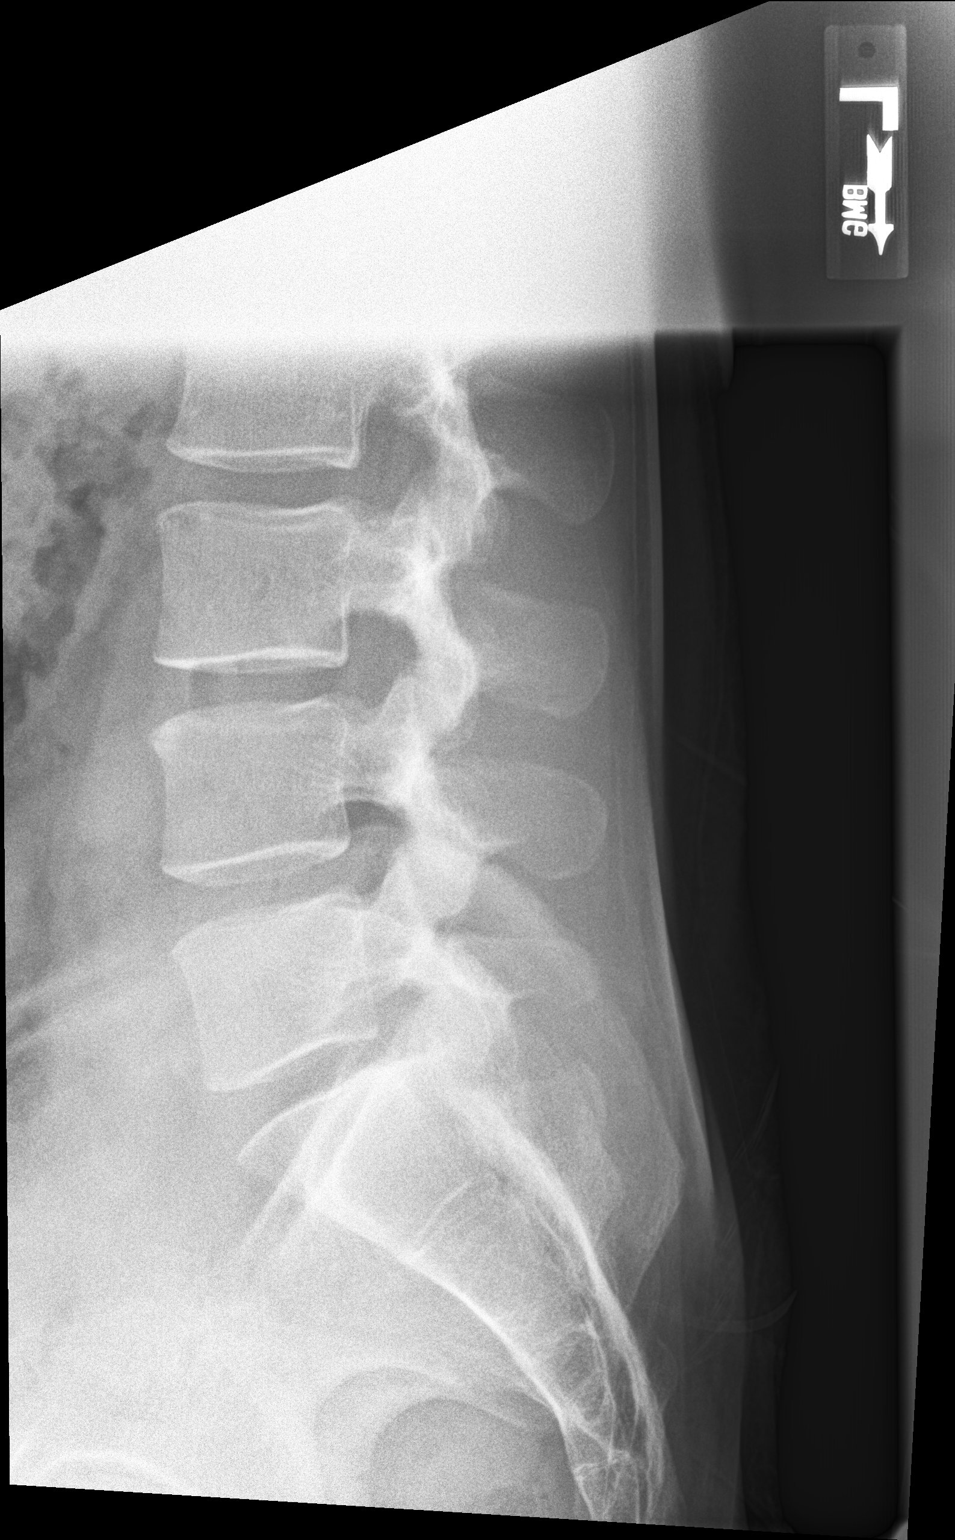

[3 of 3 positions shown; findings below may reference images not displayed]

FINDINGS: There is no evidence of lumbar spine fracture. Alignment is normal.
Intervertebral disc spaces are maintained.
IMPRESSION: Negative.
